# Patient Record
Sex: Female | Born: 1965 | Race: Black or African American | Hispanic: No | Marital: Single | Smoking: Never smoker
Health system: Southern US, Community
[De-identification: ages and names within clinical notes are randomized; demographics above are authoritative.]

## PROBLEM LIST (undated history)

## (undated) DIAGNOSIS — I10 Essential (primary) hypertension: Secondary | ICD-10-CM

## (undated) HISTORY — PX: OTHER SURGICAL HISTORY: SHX169

---

## 2001-06-25 ENCOUNTER — Emergency Department (HOSPITAL_COMMUNITY): Admission: EM | Admit: 2001-06-25 | Discharge: 2001-06-25 | Payer: Self-pay | Admitting: Emergency Medicine

## 2002-04-02 ENCOUNTER — Other Ambulatory Visit: Admission: RE | Admit: 2002-04-02 | Discharge: 2002-04-02 | Payer: Self-pay | Admitting: Family Medicine

## 2002-05-29 ENCOUNTER — Ambulatory Visit (HOSPITAL_COMMUNITY): Admission: RE | Admit: 2002-05-29 | Discharge: 2002-05-29 | Payer: Self-pay | Admitting: Gynecology

## 2002-05-29 ENCOUNTER — Encounter: Payer: Self-pay | Admitting: Obstetrics

## 2003-08-28 ENCOUNTER — Other Ambulatory Visit: Admission: RE | Admit: 2003-08-28 | Discharge: 2003-08-28 | Payer: Self-pay | Admitting: Family Medicine

## 2004-11-09 ENCOUNTER — Other Ambulatory Visit: Admission: RE | Admit: 2004-11-09 | Discharge: 2004-11-09 | Payer: Self-pay | Admitting: Family Medicine

## 2005-01-21 ENCOUNTER — Emergency Department (HOSPITAL_COMMUNITY): Admission: EM | Admit: 2005-01-21 | Discharge: 2005-01-21 | Payer: Self-pay | Admitting: Family Medicine

## 2006-05-28 ENCOUNTER — Emergency Department (HOSPITAL_COMMUNITY): Admission: EM | Admit: 2006-05-28 | Discharge: 2006-05-28 | Payer: Self-pay | Admitting: Emergency Medicine

## 2006-06-01 ENCOUNTER — Other Ambulatory Visit: Admission: RE | Admit: 2006-06-01 | Discharge: 2006-06-01 | Payer: Self-pay | Admitting: Family Medicine

## 2006-11-10 ENCOUNTER — Ambulatory Visit (HOSPITAL_COMMUNITY): Admission: RE | Admit: 2006-11-10 | Discharge: 2006-11-10 | Payer: Self-pay | Admitting: Obstetrics

## 2006-11-10 IMAGING — US US PELVIS COMPLETE MODIFY
1 series · 13 of 25 positions shown · non-contrast
Comparison: Report from prior pelvic ultrasound on [DATE].

CLINICAL DATA: Menorrhagia. Dysfunctional uterine bleeding past 5 months.
TRANSABDOMINAL AND TRANSVAGINAL PELVIC ULTRASOUND:
TECHNIQUE: Both transabdominal and transvaginal ultrasound examinations of the pelvis were performed including evaluation of the uterus, ovaries, adnexal regions, and pelvic cul-de-sac.

[Series 1: us pelvis complete modify · 0.30mm/px · 13 of 84 slices shown]
[im 1/84]
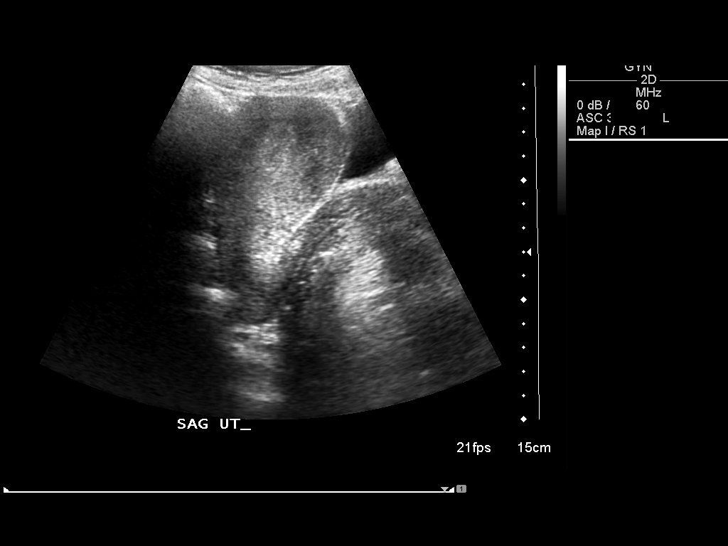
[im 7/84]
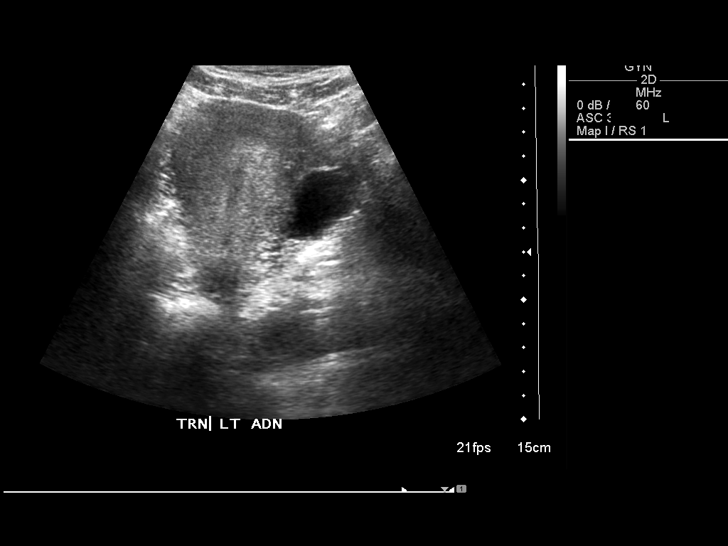
[im 14/84]
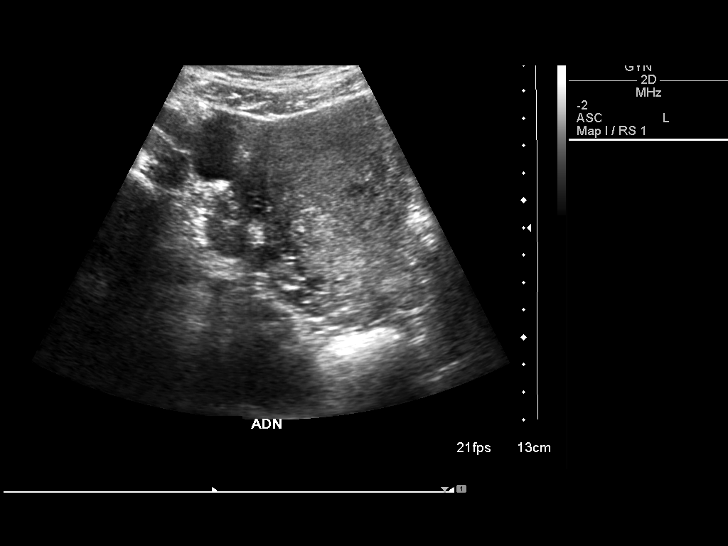
[im 21/84]
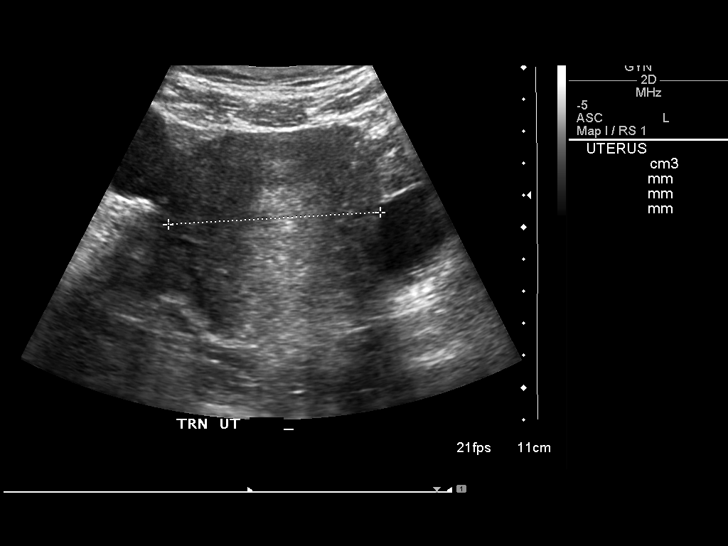
[im 28/84]
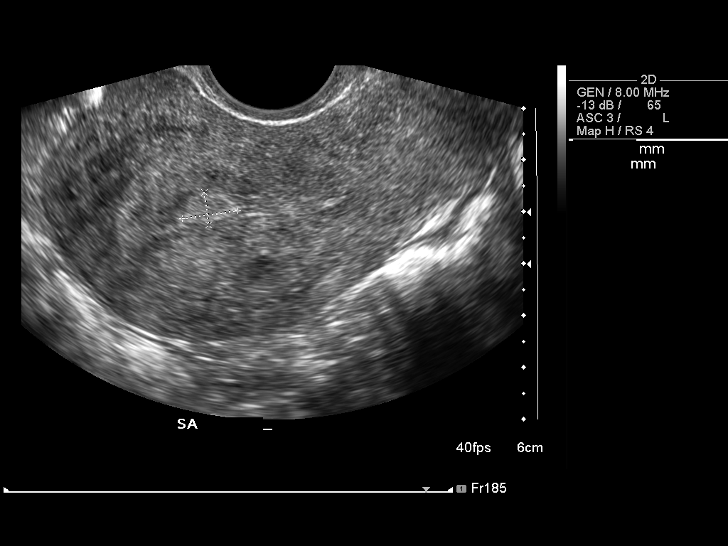
[im 35/84]
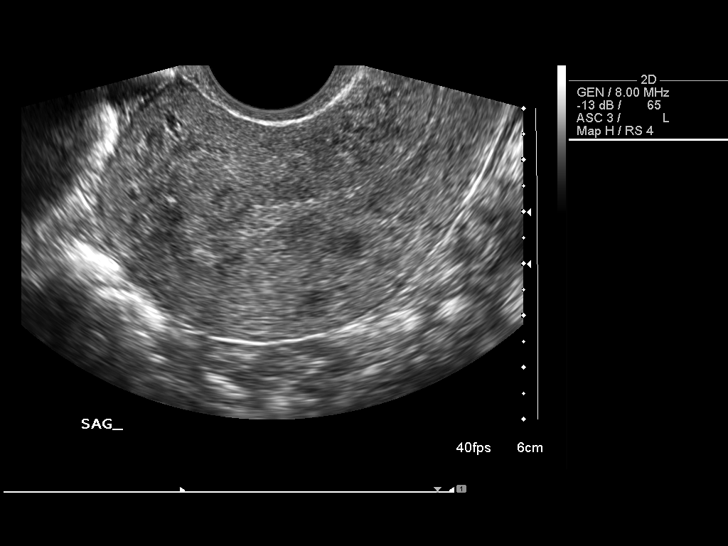
[im 42/84]
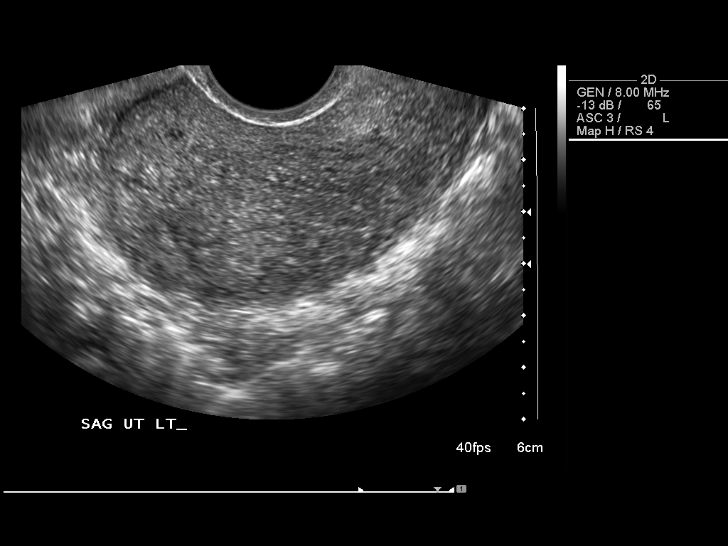
[im 49/84]
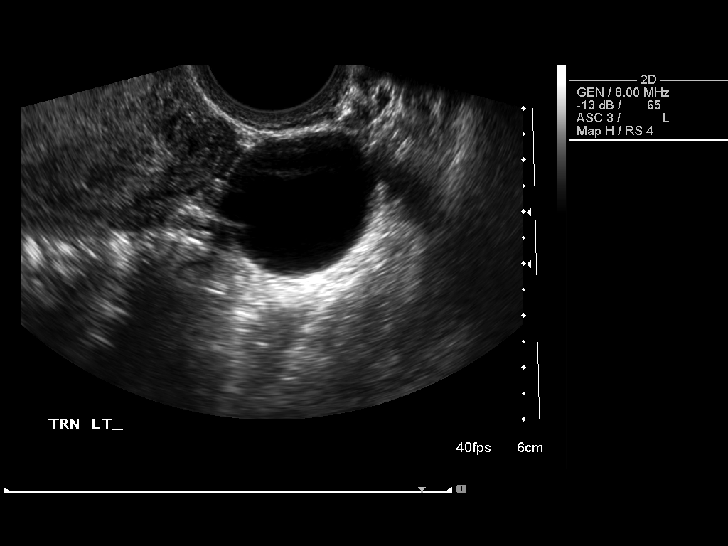
[im 56/84]
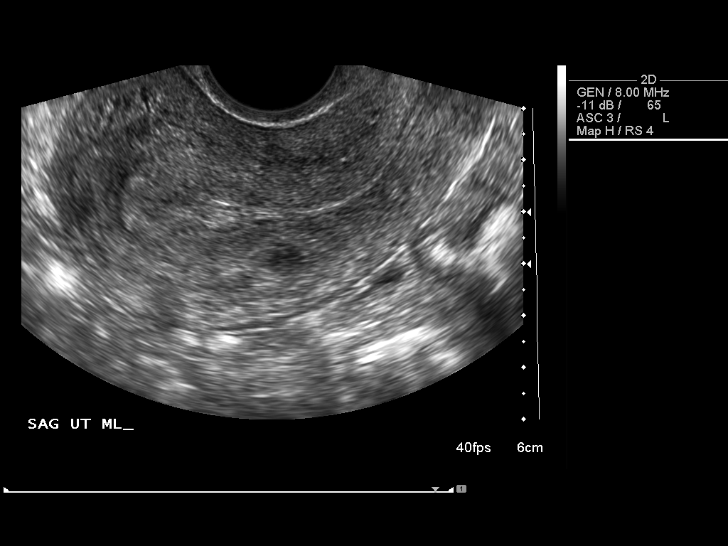
[im 63/84]
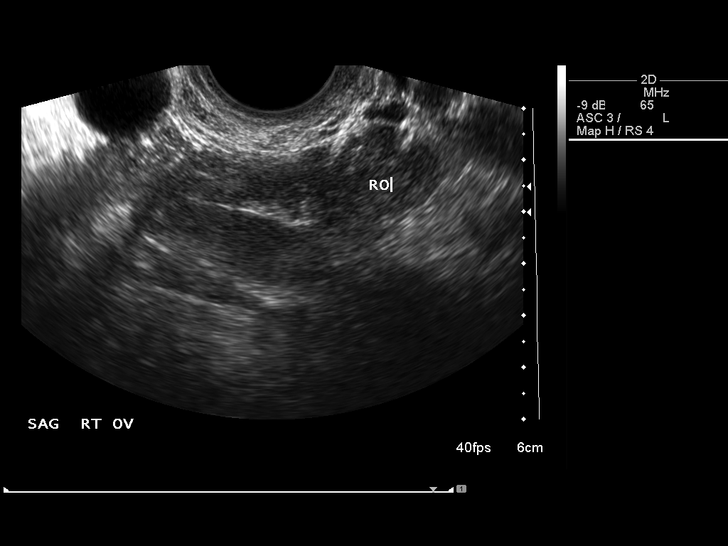
[im 70/84]
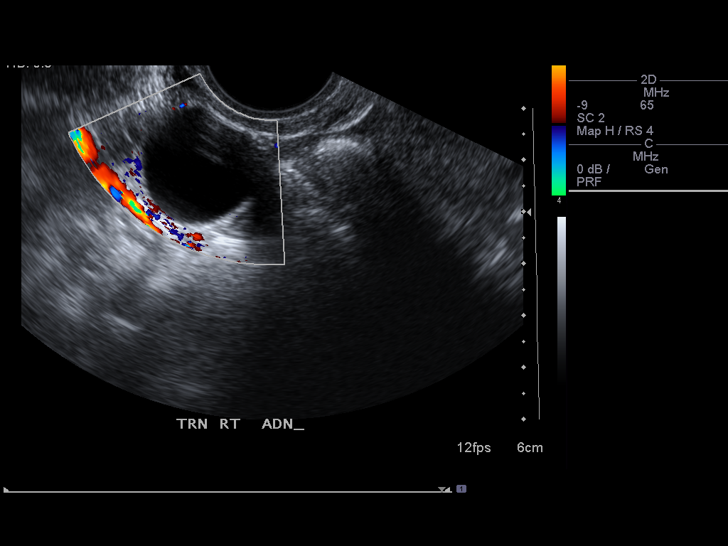
[im 77/84]
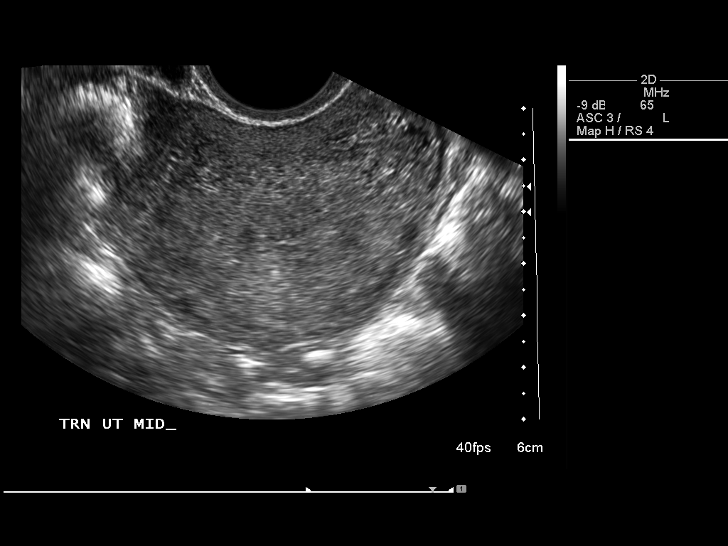
[im 84/84]
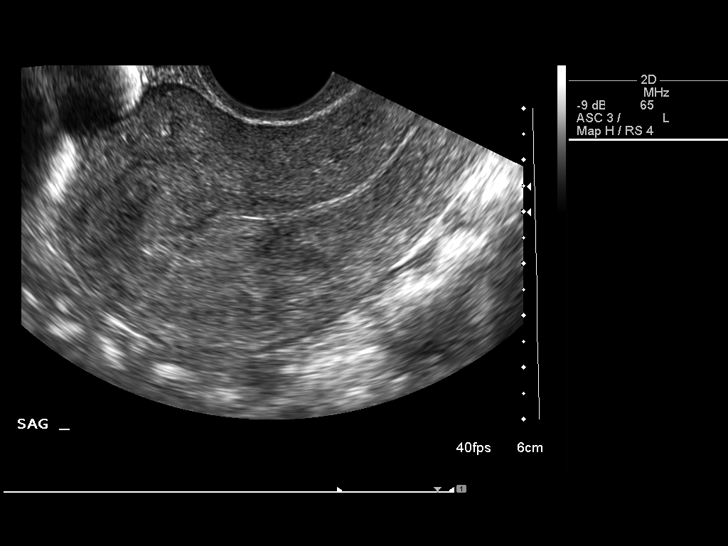

[13 of 25 positions shown; findings below may reference images not displayed]

FINDINGS: The uterus is at the upper limits of normal in size measuring approximately 10 x 5 x 6 cm.  Diffusely heterogeneous appearance of the myometrium is seen with several small posterior uterine fibroids measuring 1 cm in diameter or less.  Endometrial thickness measures approximately 7 mm.  A focal hyperechoic area is again seen within the central portion of the endometrial stripe measuring approximately 6 x 10 mm, and this could represent blood clot given the patient?s history of ongoing bleeding, although an endometrial polyp cannot be excluded. 
The right ovary is normal in appearance. A simple cyst is seen in the right adnexa separate from the ovary which measures 3 cm.  This is unchanged compared with the prior report and is consistent with a paraovarian cyst.    There is also a simple cyst along the margin of the left ovary which measures 4.0 x 2.8 x 2.9 cm.  This was not described on previous study and could represent an exophytic ovarian cyst or paraovarian cyst.  There is no evidence of free fluid.
IMPRESSION: 1.  Several small posterior uterine fibroids measuring 1 cm in size or less.
2.  6 x 10 mm hyperechoic area in the endometrial cavity.  This could represent blood clot, however, an endometrial polyp cannot be excluded. Sonohysterogram is recommended for further evaluation.
3.  Stable 3 cm right paraovarian cyst.
4.  4 cm simple cyst in the left adnexa, which was not described on prior ultrasound report in [9B], and may represent an ovarian cyst or paraovarian cyst.

## 2006-11-20 ENCOUNTER — Encounter: Admission: RE | Admit: 2006-11-20 | Discharge: 2006-11-20 | Payer: Self-pay | Admitting: Family Medicine

## 2006-11-20 IMAGING — CR DG SINUSES COMPLETE 3+V
3 series · 3 of 3 positions shown · non-contrast
Comparison: none

CLINICAL DATA: Frontal and maxillary pain. 
 SINUSES - 3 VIEW:

[[person_name]]
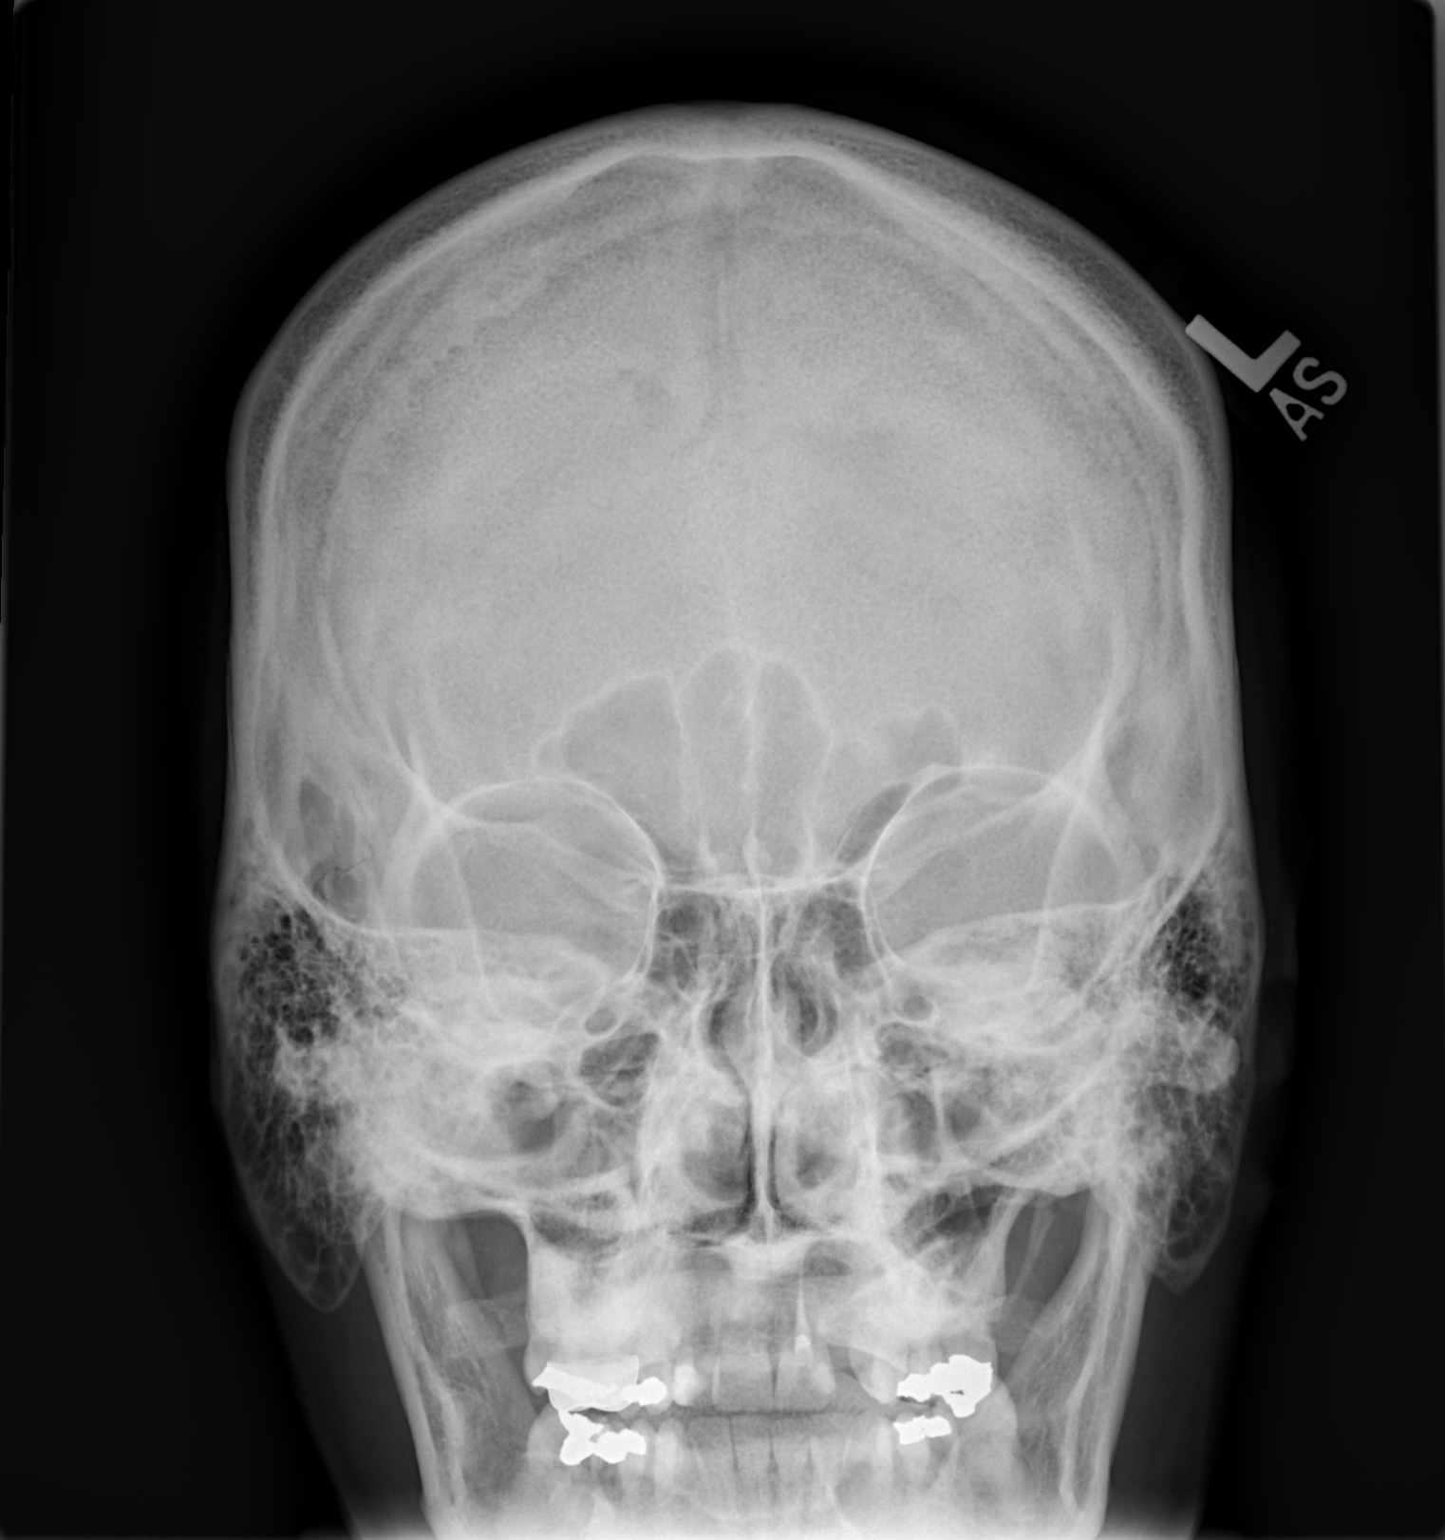

[w waters]
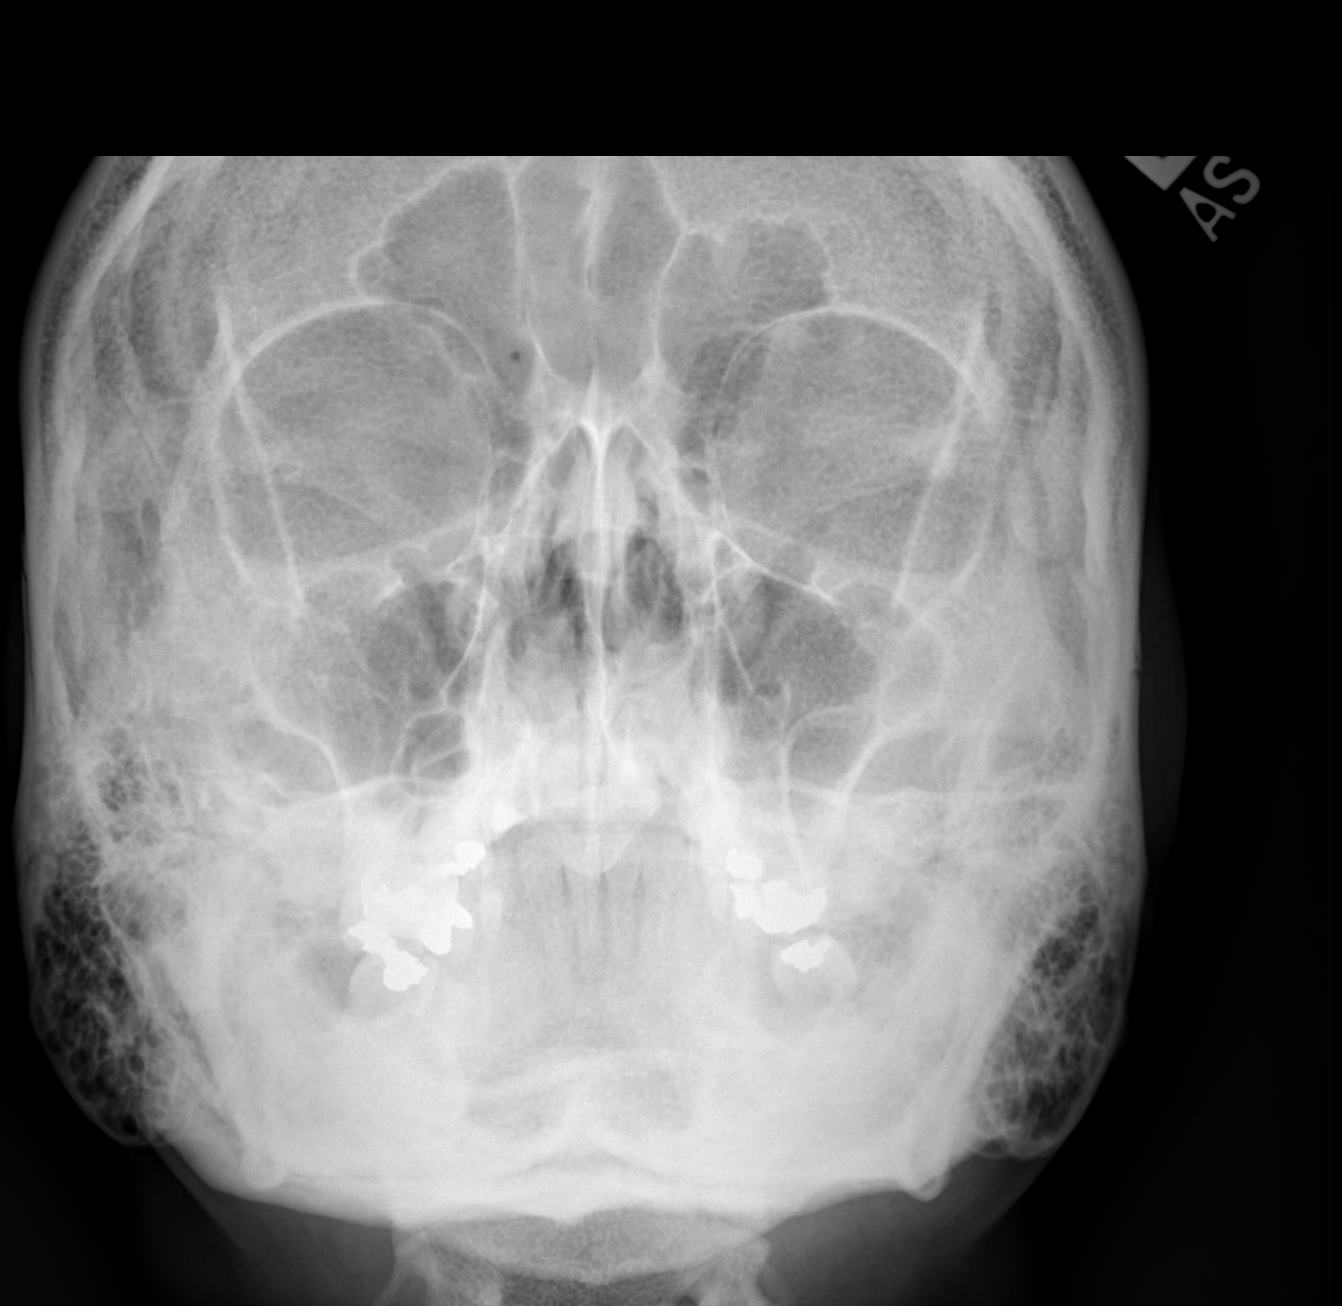

[w smv]
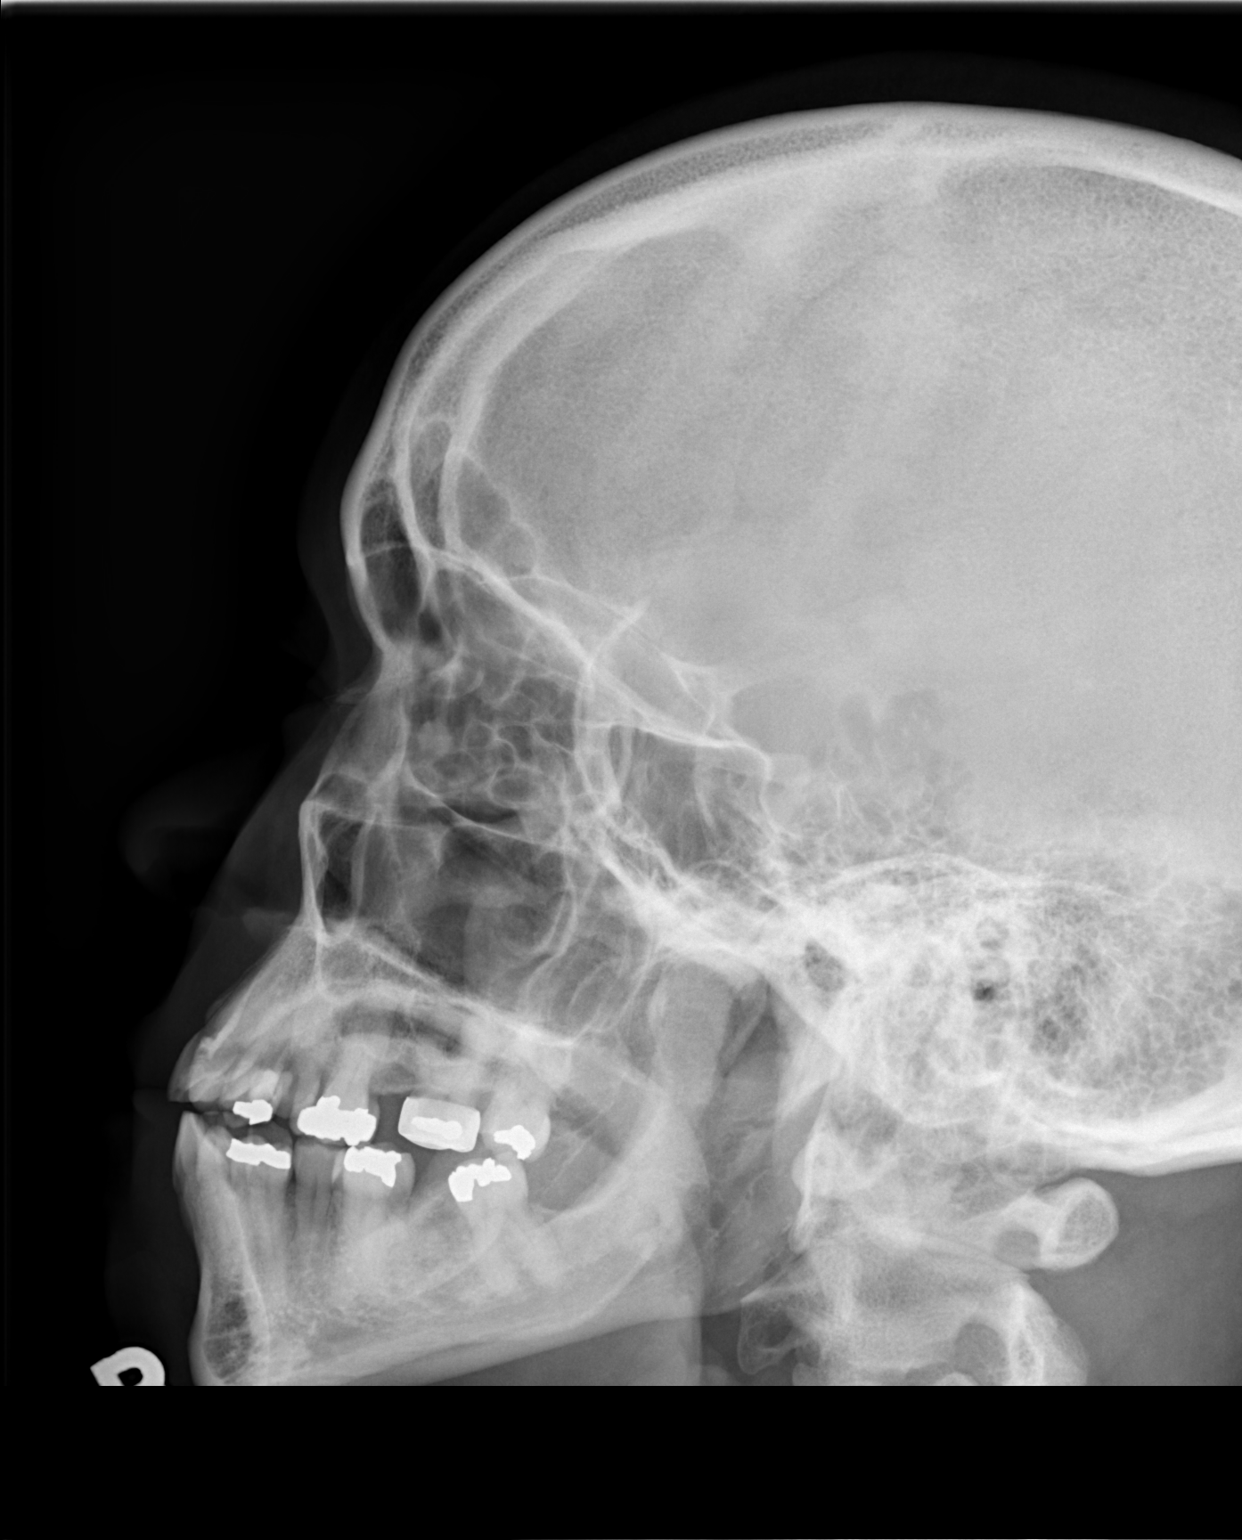

[3 of 3 positions shown; findings below may reference images not displayed]

FINDINGS: The mastoid air cells and sinuses clear.  Specifically, there is no air fluid densities nor mucoperiosteal thickening.
IMPRESSION: Negative for sinusitis.

## 2006-12-07 ENCOUNTER — Ambulatory Visit (HOSPITAL_COMMUNITY): Admission: RE | Admit: 2006-12-07 | Discharge: 2006-12-07 | Payer: Self-pay | Admitting: Obstetrics

## 2006-12-07 ENCOUNTER — Encounter: Payer: Self-pay | Admitting: Obstetrics

## 2007-02-22 ENCOUNTER — Emergency Department (HOSPITAL_COMMUNITY): Admission: EM | Admit: 2007-02-22 | Discharge: 2007-02-23 | Payer: Self-pay | Admitting: Emergency Medicine

## 2007-02-22 IMAGING — CR DG CERVICAL SPINE COMPLETE 4+V
5 series · 5 of 5 positions shown · non-contrast
Comparison: none

CLINICAL DATA: Right shoulder numbness.
 CERVICAL SPINE ? 5 VIEWS ? [DATE]:

[w c-spine lat]
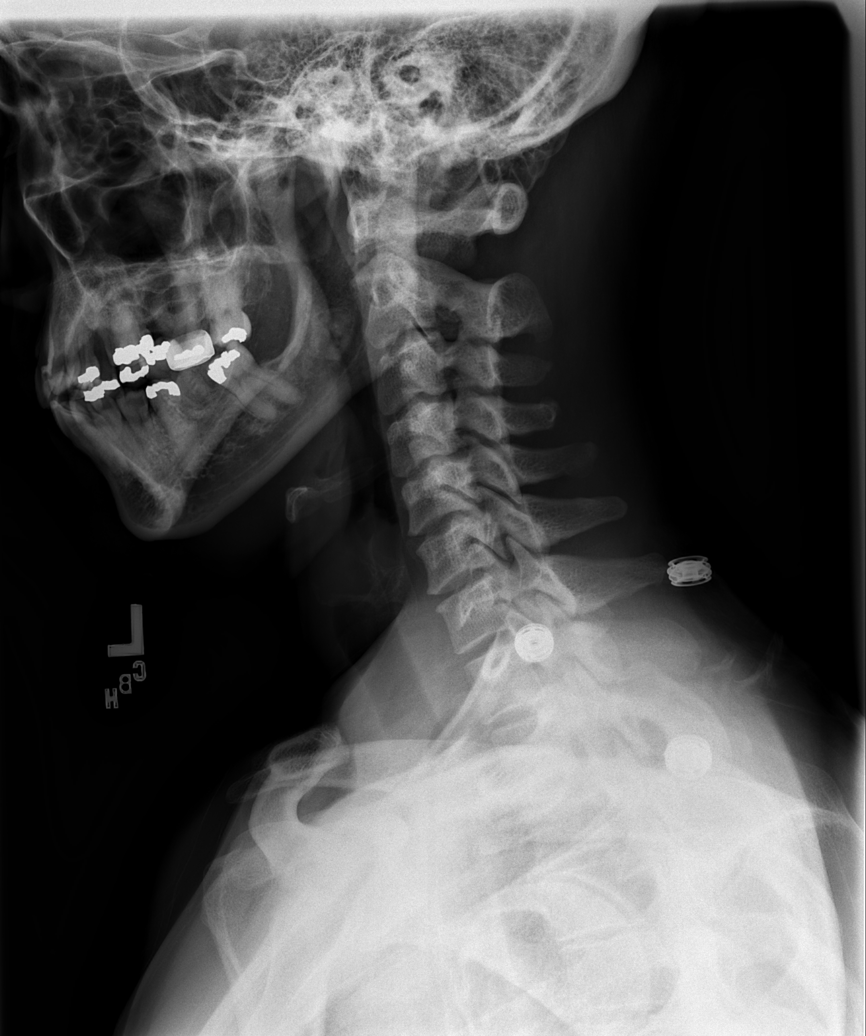

[w c-spine oblique (1 of 2)]
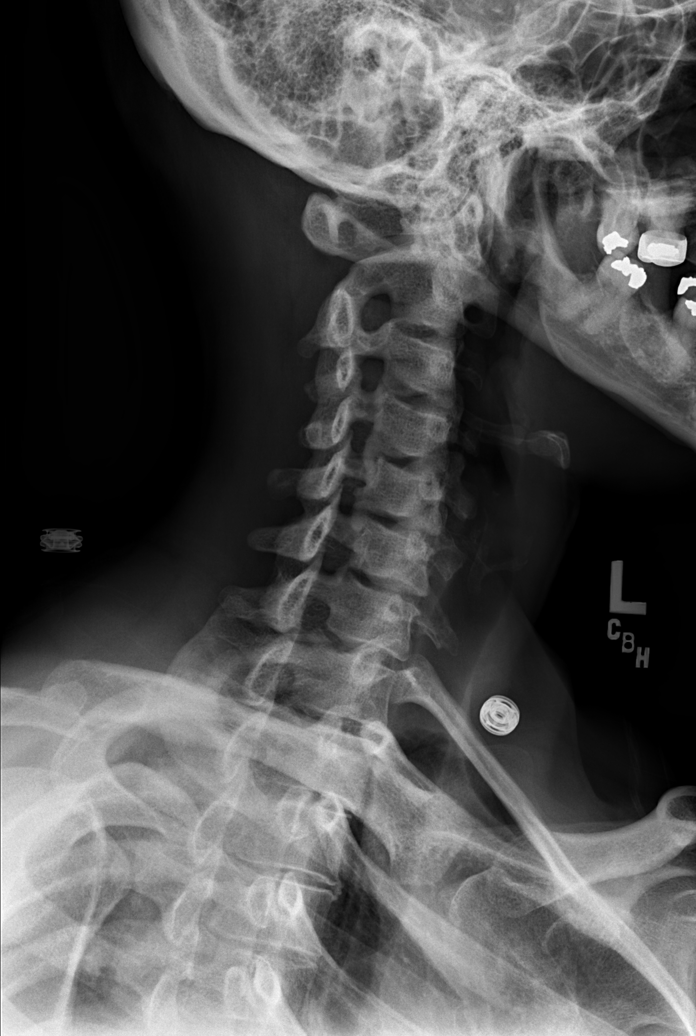

[w c-spine oblique (2 of 2)]
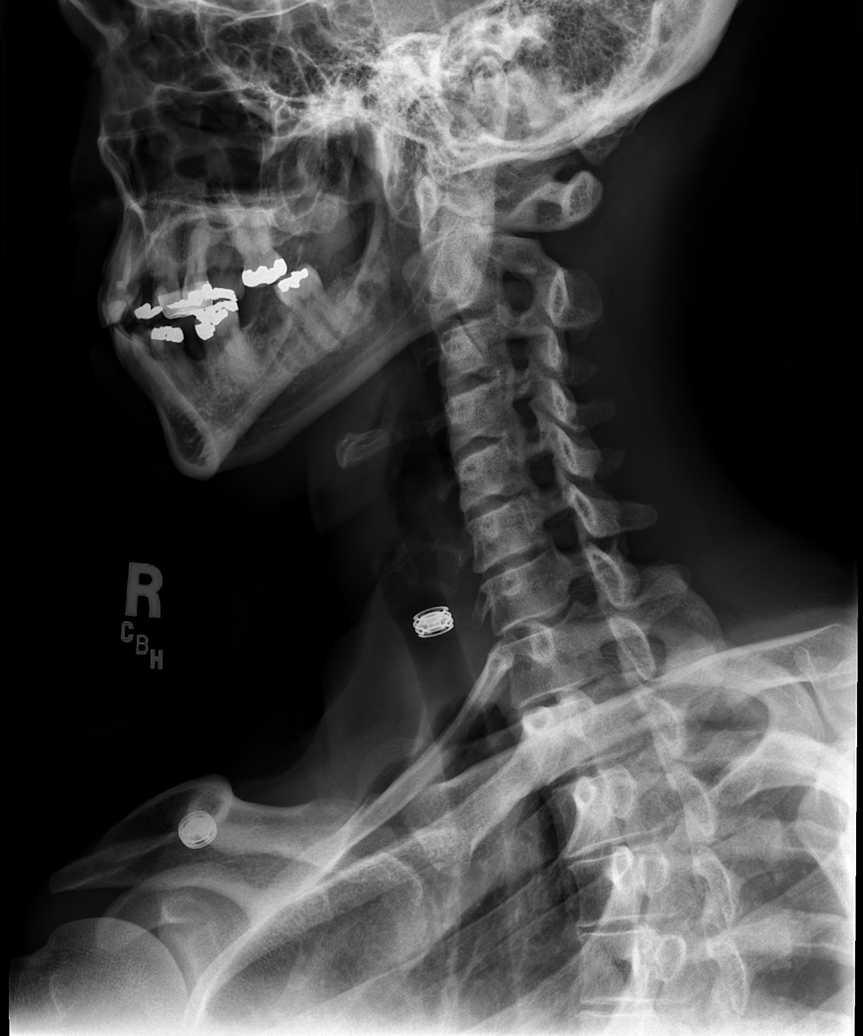

[w c-spine a.p.]
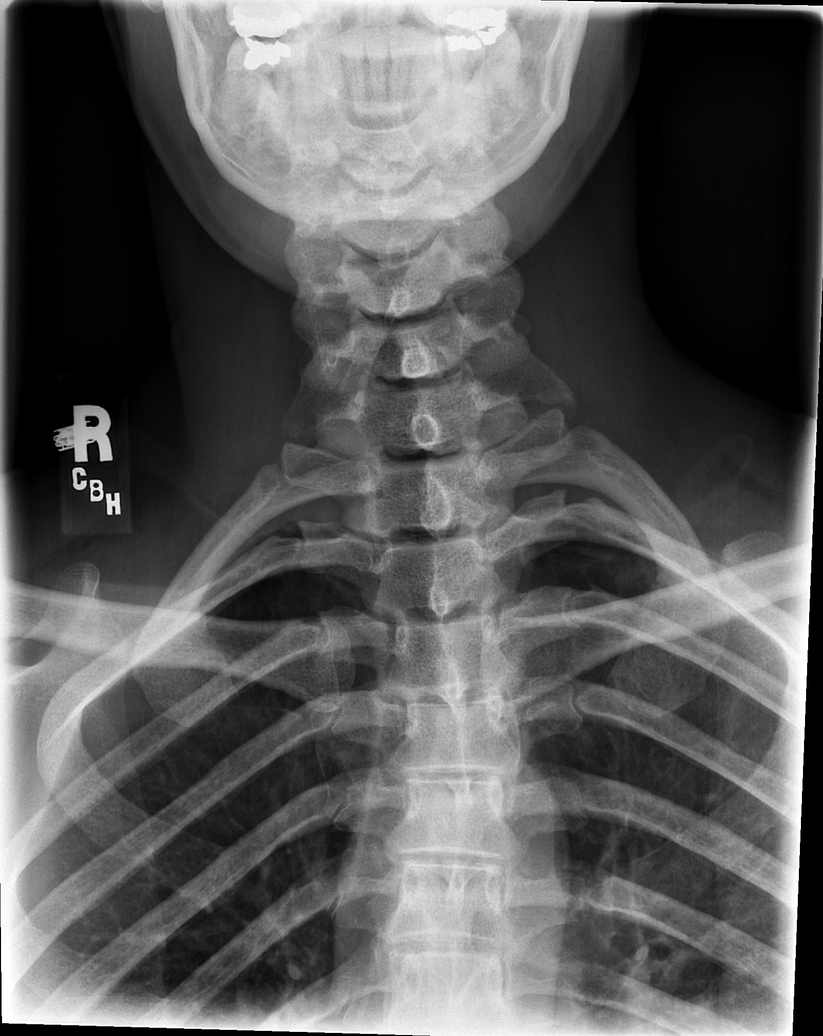

[w c-spine odontoid]
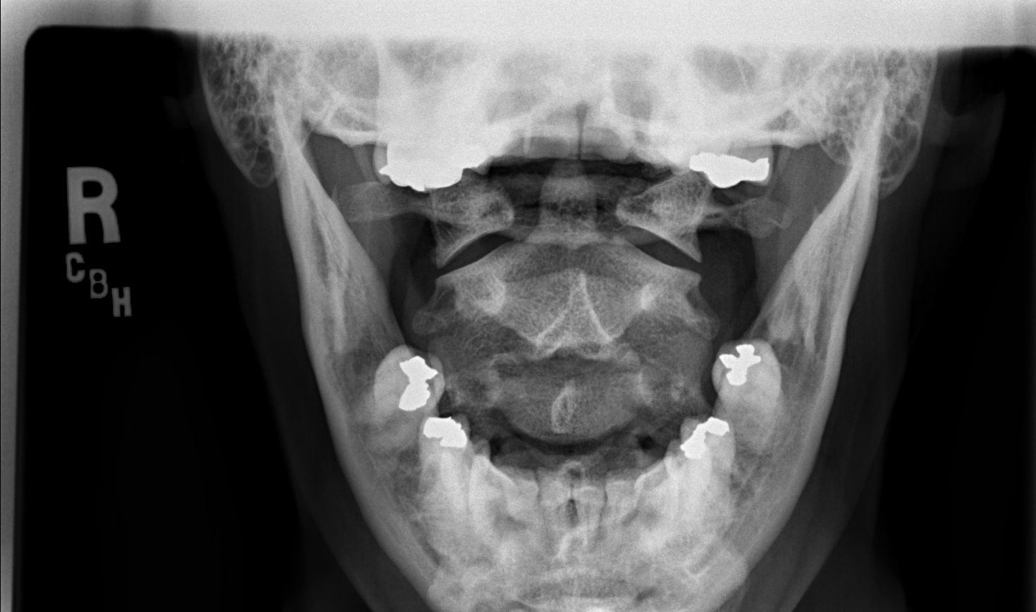

[5 of 5 positions shown; findings below may reference images not displayed]

FINDINGS: There is no evidence of fracture or prevertebral soft tissue soft tissue swelling.  Alignment is normal.  The intervertebral disk spaces are within normal limits.  No other significant bone abnormalities are identified.
IMPRESSION: Negative cervical spine radiographs.

## 2008-06-26 ENCOUNTER — Emergency Department (HOSPITAL_COMMUNITY): Admission: EM | Admit: 2008-06-26 | Discharge: 2008-06-26 | Payer: Self-pay | Admitting: Emergency Medicine

## 2008-06-26 IMAGING — CR DG LUMBAR SPINE COMPLETE 4+V
5 series · 5 of 5 positions shown · non-contrast
Comparison: None

CLINICAL DATA: Back pain

LUMBAR SPINE - COMPLETE 4+ VIEW

[t l-spine a.p.]
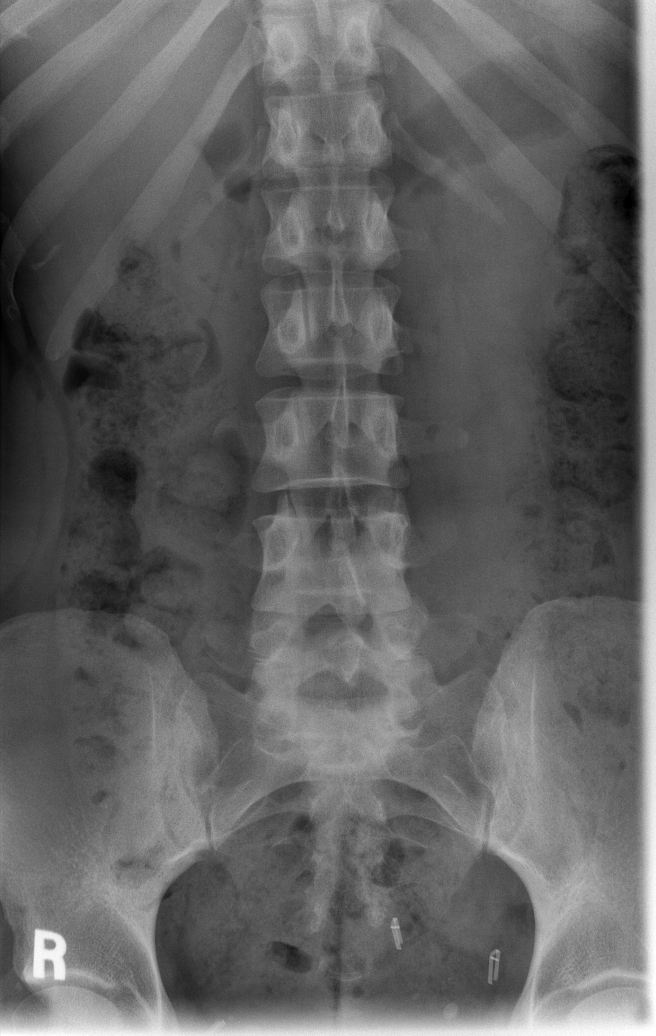

[t l-spine oblique exposure (1 of 2)]
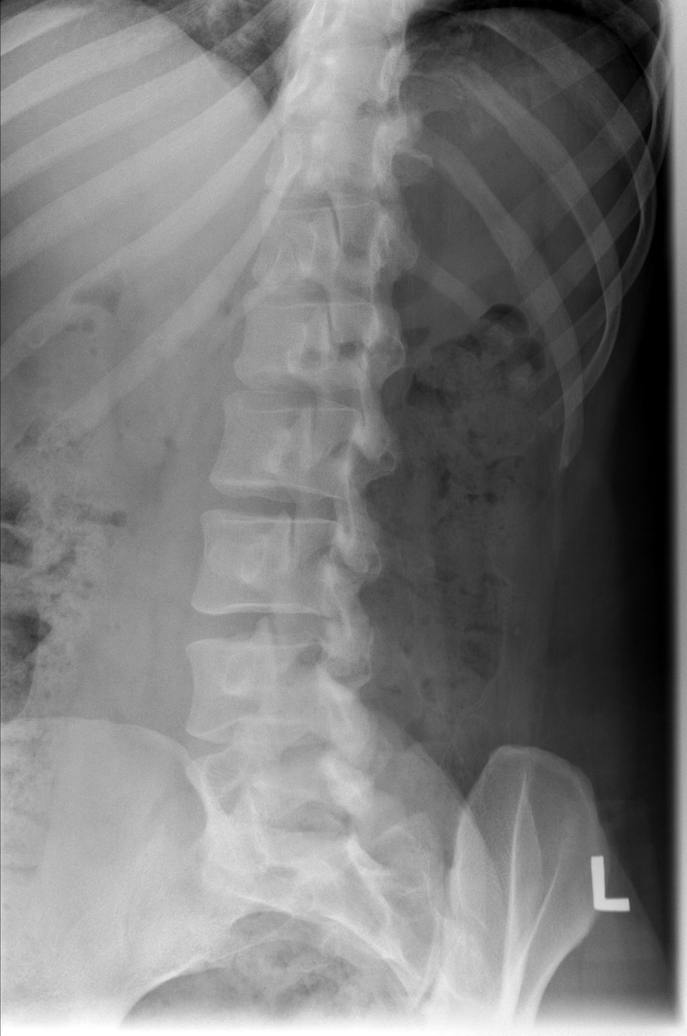

[t l-spine oblique exposure (2 of 2)]
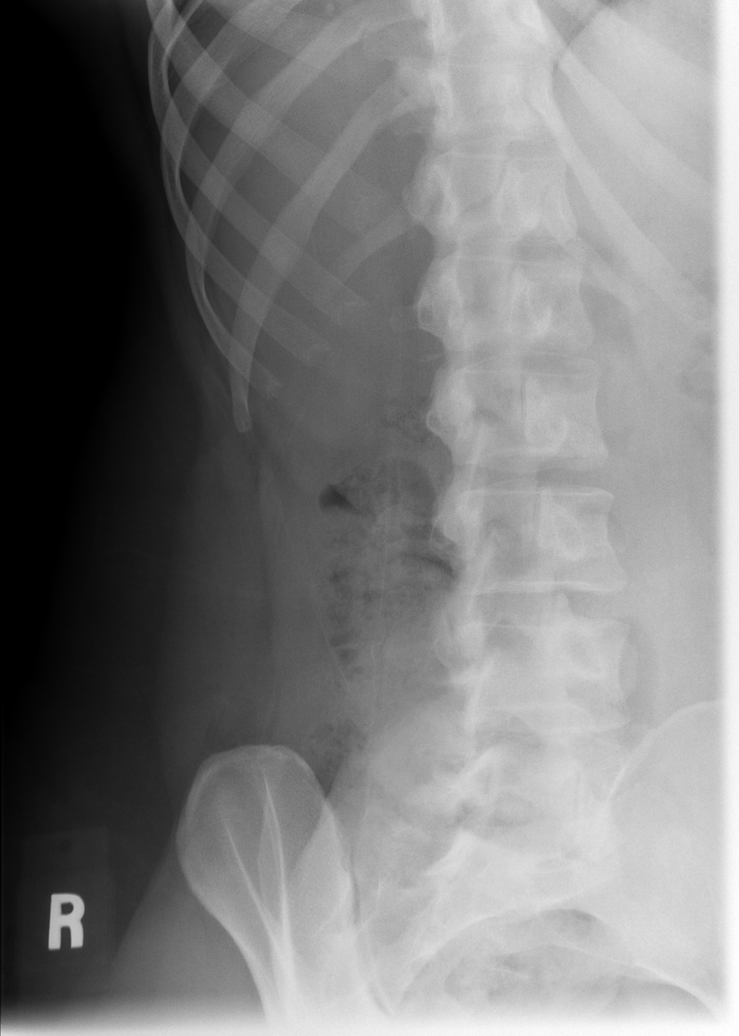

[t l-spine lat]
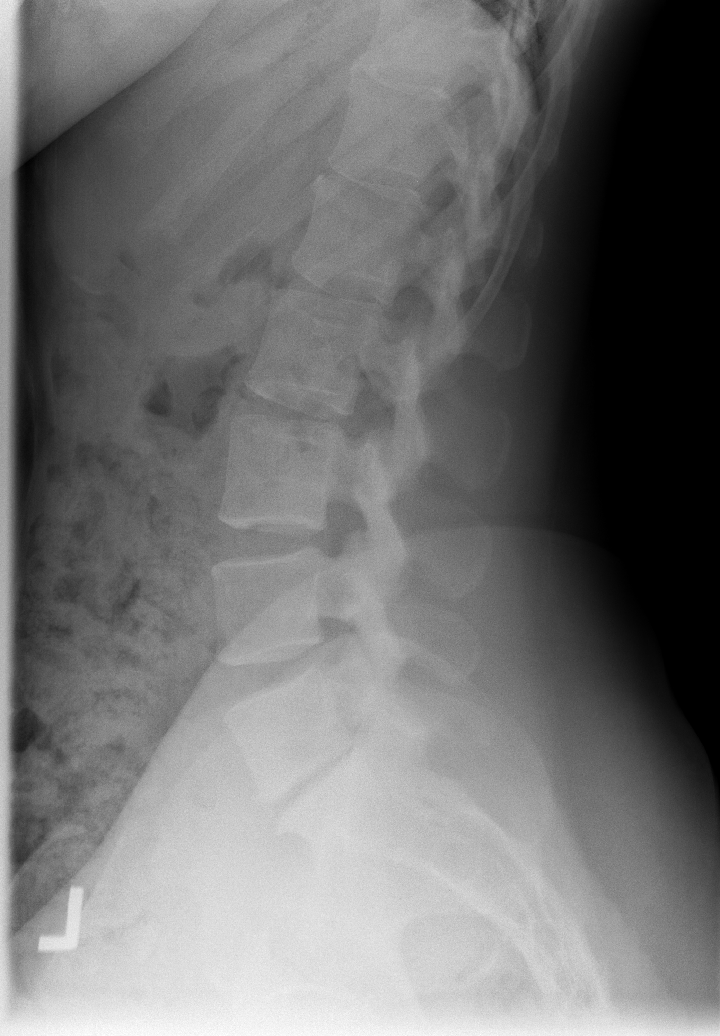

[t l-spine l5-s1 spot]
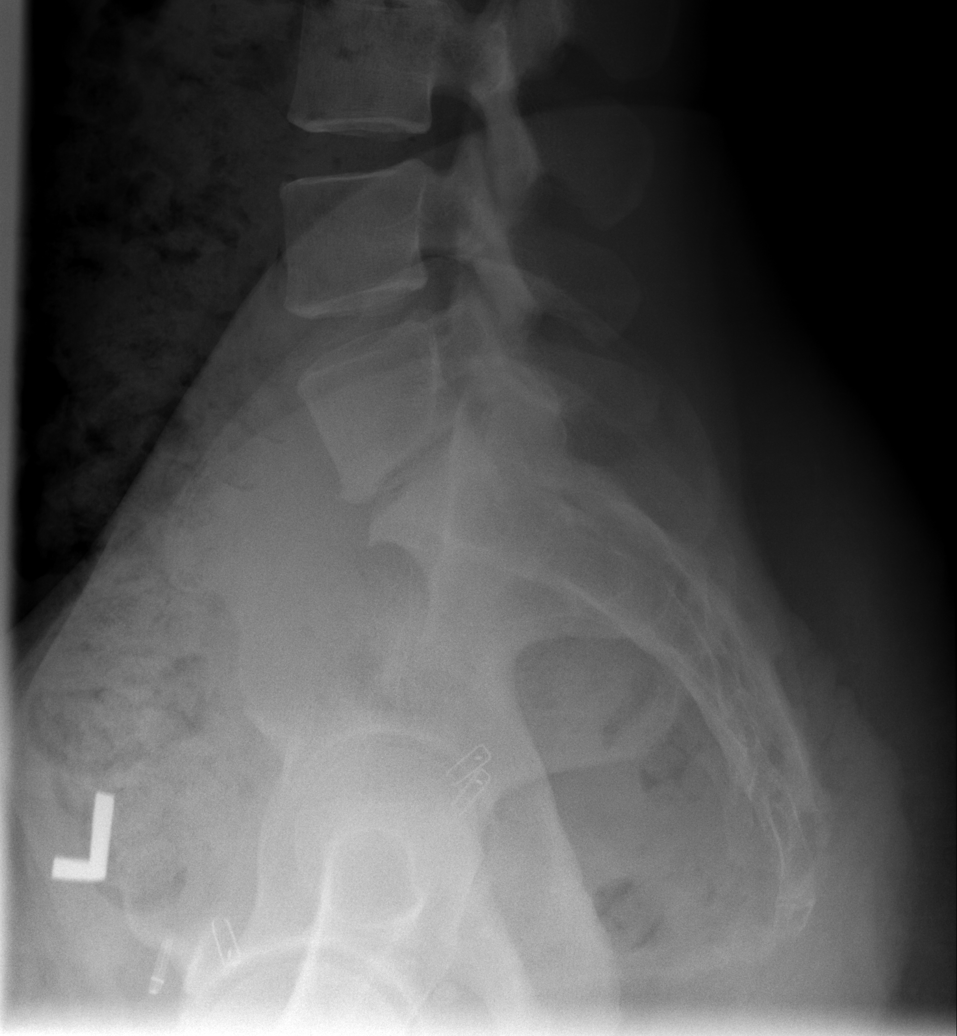

[5 of 5 positions shown; findings below may reference images not displayed]

FINDINGS: There are five non-rib bearing lumbar type vertebra.
There is marked degenerative disc disease at L5-S1 with mild facet
arthropathy.  Other disc space levels normal.  No fracture or
subluxation.  Soft tissues unremarkable except for clips related to
prior tubal ligation.
IMPRESSION: Degenerative disc disease at L5-S1.  No other acute or significant
findings.

## 2008-06-26 IMAGING — CT CT PELVIS W/O CM
2 of 4 series · 17 of 46 positions shown, 19 images · non-contrast
Comparison: None

CT ABDOMEN

CLINICAL DATA: Low back pain, right flank pain.

CT OF THE ABDOMEN AND PELVIS WITHOUT CONTRAST (CT UROGRAM)
TECHNIQUE: Multidetector CT imaging was performed through the
abdomen and pelvis to include the urinary tract.

[Series 2: <(id) stone a/p >(id) · axial · 0.75mm/px · z∈[-443,-68]mm · 14 of 83 slices shown, 16 images]
[im 4/83  soft-tissue]
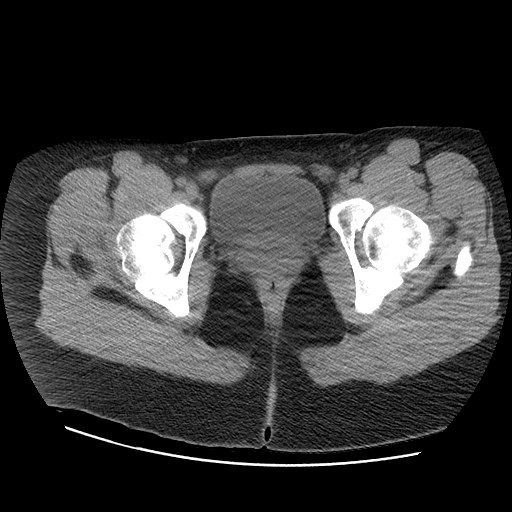
[im 4/83  bone]
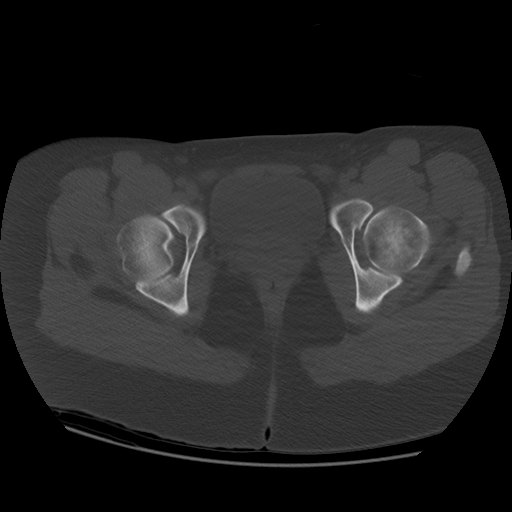
[im 10/83  soft-tissue]
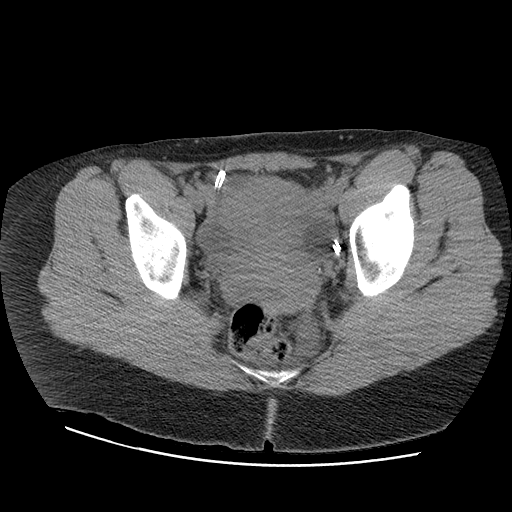
[im 16/83  soft-tissue]
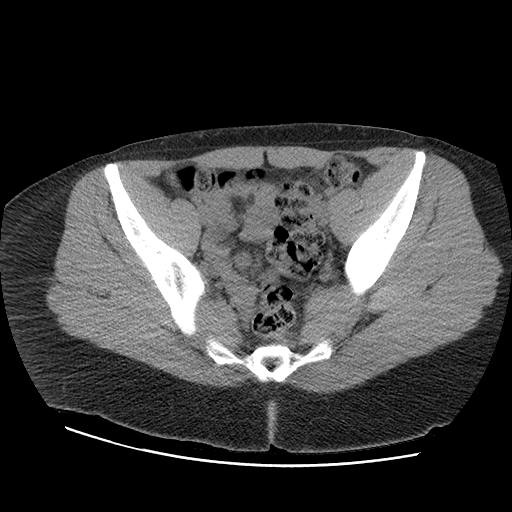
[im 23/83  soft-tissue]
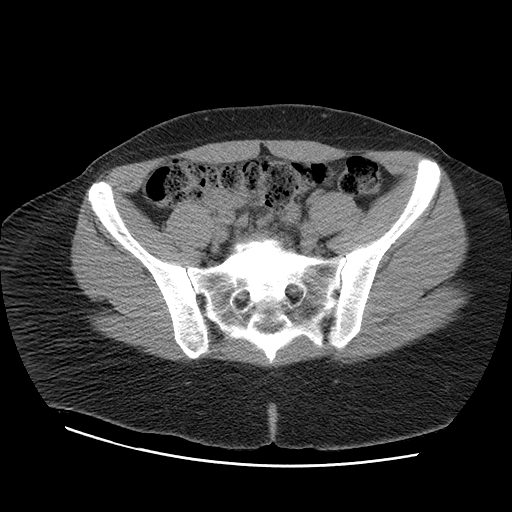
[im 29/83  soft-tissue]
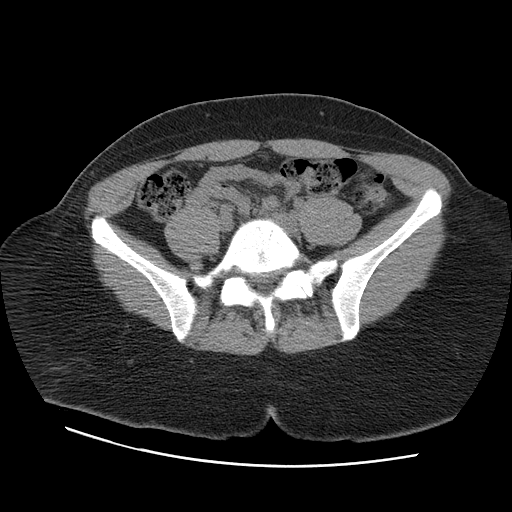
[im 32/83  soft-tissue]
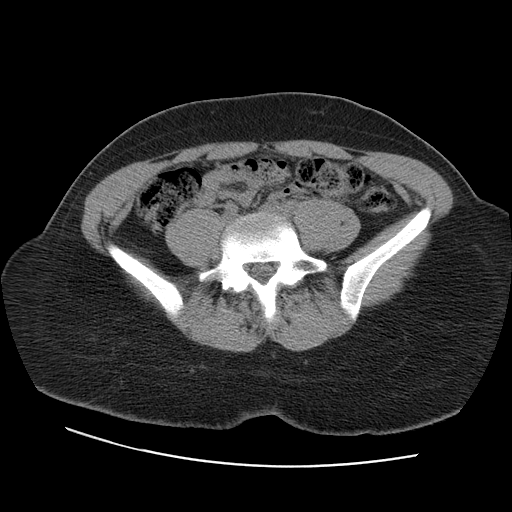
[im 38/83  soft-tissue]
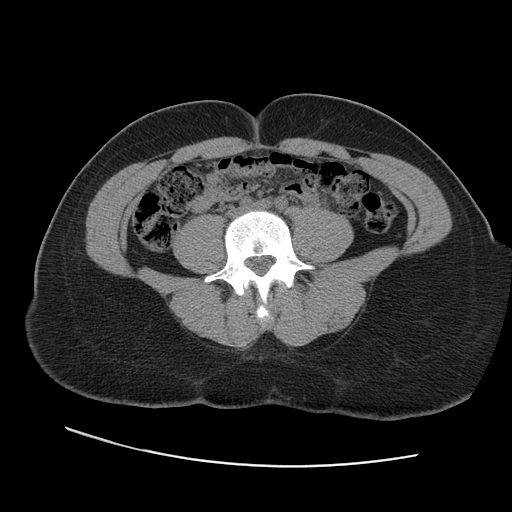
[im 45/83  soft-tissue]
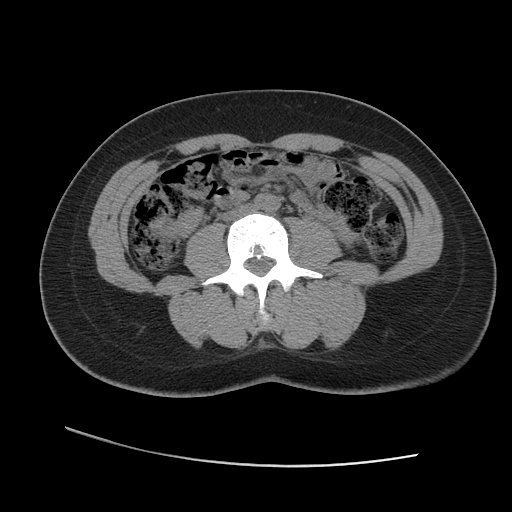
[im 51/83  soft-tissue]
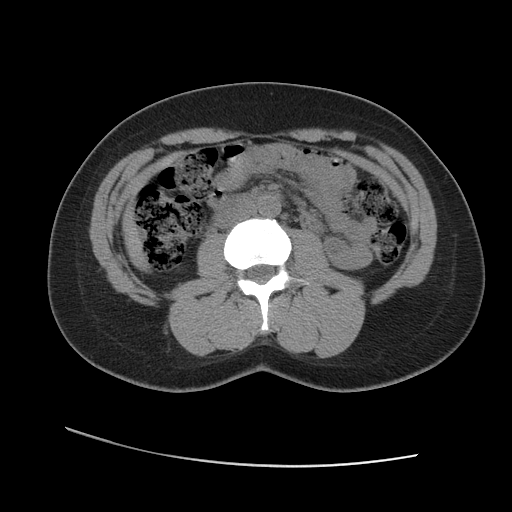
[im 51/83  bone]
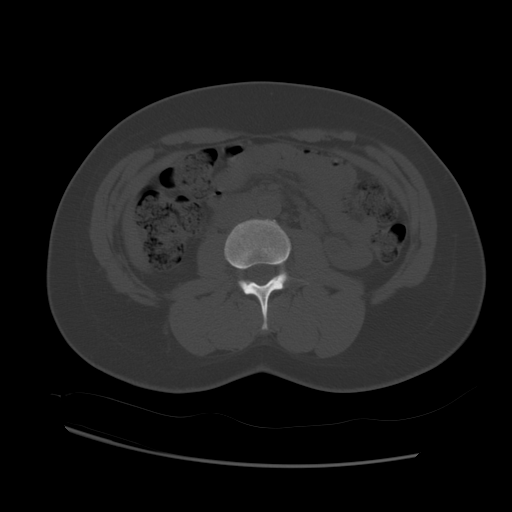
[im 54/83  soft-tissue]
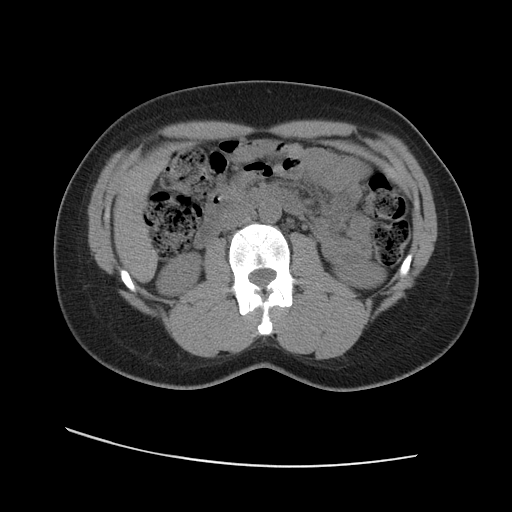
[im 60/83  soft-tissue]
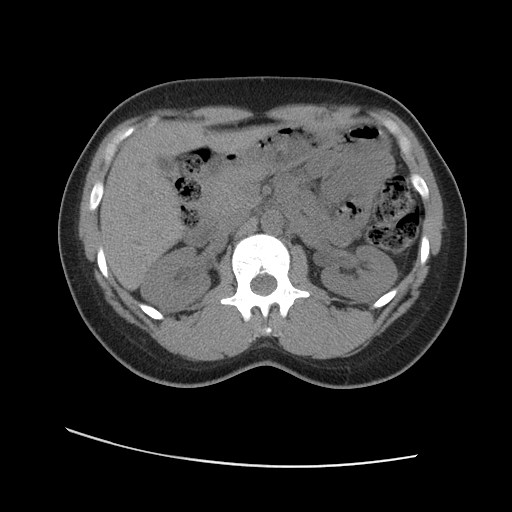
[im 67/83  soft-tissue]
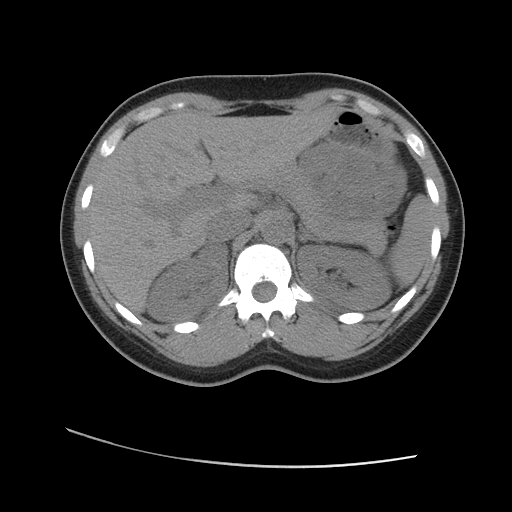
[im 73/83  soft-tissue]
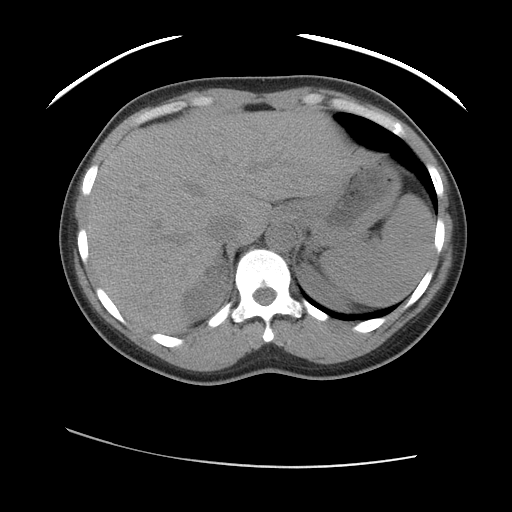
[im 79/83  soft-tissue]
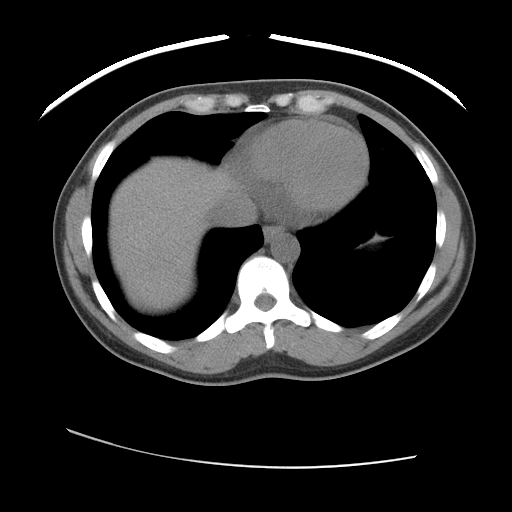

[Series 401: cor · coronal · 0.94mm/px · 3 of 89 slices shown]
[im 30/89  soft-tissue]
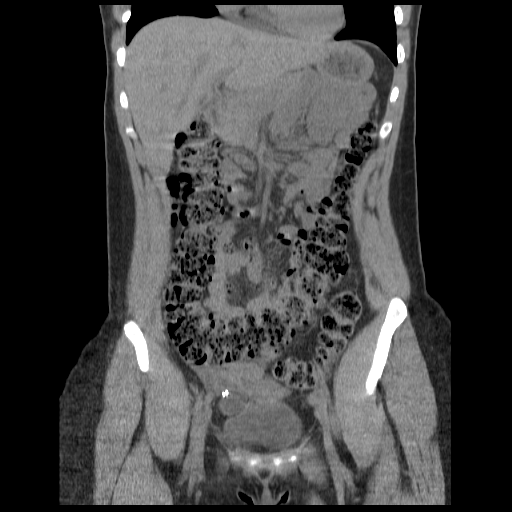
[im 40/89  soft-tissue]
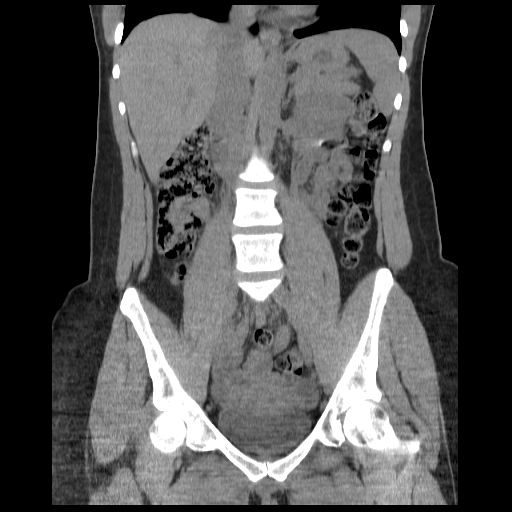
[im 49/89  soft-tissue]
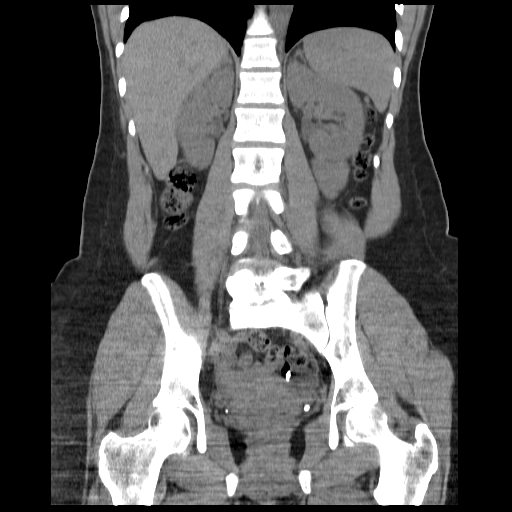

[17 of 46 positions shown; findings below may reference images not displayed]

FINDINGS: Lung bases are clear.  No effusions.  Heart is normal
size.

Suspect tiny punctate nonobstructing stone in the lower pole of the
right kidney.  No hydronephrosis.  No proximal ureteral stones.

Gallbladder is contracted.  Solid organs have an unremarkable
unenhanced appearance. Bowel grossly unremarkable.  No free fluid,
free air, or adenopathy. Aorta is normal caliber.

No acute bony abnormality.
IMPRESSION: No acute findings in the abdomen.

Possible tiny punctate nonobstructing stone in the lower pole of
the right kidney.

CT PELVIS
FINDINGS: Ureters are decompressed and difficult to follow.
Multiple phleboliths noted in the pelvis.  There is a tiny punctate
calcification on the right that on image 77.  I suspect this is a
phlebolith, but it is difficult to completely clear the distal
right ureter.  Given that there is no hydronephrosis or
hydroureter, distal ureteral stone is felt unlikely.

Bilateral ovarian cysts present.  The patient is status post
bilateral tubal ligation.  Uterus grossly unremarkable unenhanced
appearance.

Bowel grossly unremarkable.  No free fluid, free air, or
adenopathy. Appendix visualized and is normal.
IMPRESSION: No definite ureteral calculi.  There is a small punctate
calcification in the right side of the pelvis which cannot be
definitively cleared from the distal right ureter, but this is felt
less likely given the decompressed right urinary tract.

Bilateral ovarian cysts.

Appendix normal.

## 2008-07-02 ENCOUNTER — Other Ambulatory Visit: Admission: RE | Admit: 2008-07-02 | Discharge: 2008-07-02 | Payer: Self-pay | Admitting: Family Medicine

## 2010-04-14 ENCOUNTER — Inpatient Hospital Stay (INDEPENDENT_AMBULATORY_CARE_PROVIDER_SITE_OTHER)
Admission: RE | Admit: 2010-04-14 | Discharge: 2010-04-14 | Disposition: A | Discharge status: Home / Self Care | Payer: Self-pay | Source: Ambulatory Visit | Attending: Family Medicine | Admitting: Family Medicine

## 2010-04-14 DIAGNOSIS — M545 Low back pain, unspecified: Secondary | ICD-10-CM

## 2010-04-14 DIAGNOSIS — R350 Frequency of micturition: Secondary | ICD-10-CM

## 2010-04-14 LAB — POCT URINALYSIS DIP (DEVICE)
Nitrite: NEGATIVE
Protein, ur: NEGATIVE mg/dL
Specific Gravity, Urine: 1.015 (ref 1.005–1.030)
Urobilinogen, UA: 0.2 mg/dL (ref 0.0–1.0)

## 2010-04-14 LAB — POCT PREGNANCY, URINE: Preg Test, Ur: NEGATIVE

## 2010-04-16 LAB — URINE CULTURE: Colony Count: >=100000

## 2010-05-03 LAB — URINE MICROSCOPIC-ADD ON

## 2010-05-03 LAB — URINALYSIS, ROUTINE W REFLEX MICROSCOPIC
Protein, ur: NEGATIVE mg/dL
Specific Gravity, Urine: 1.029 (ref 1.005–1.030)
Urobilinogen, UA: 1 mg/dL (ref 0.0–1.0)

## 2010-05-03 LAB — URINE CULTURE: Colony Count: 30000

## 2010-05-31 ENCOUNTER — Other Ambulatory Visit (HOSPITAL_COMMUNITY): Payer: Self-pay | Admitting: Family Medicine

## 2010-05-31 DIAGNOSIS — Z1231 Encounter for screening mammogram for malignant neoplasm of breast: Secondary | ICD-10-CM

## 2010-06-04 ENCOUNTER — Ambulatory Visit (HOSPITAL_COMMUNITY)
Admission: RE | Admit: 2010-06-04 | Discharge: 2010-06-04 | Disposition: A | Discharge status: Home / Self Care | Payer: Self-pay | Source: Ambulatory Visit | Attending: Family Medicine | Admitting: Family Medicine

## 2010-06-04 DIAGNOSIS — Z1231 Encounter for screening mammogram for malignant neoplasm of breast: Secondary | ICD-10-CM

## 2010-06-08 NOTE — Op Note (Signed)
[name], Debra Duncan              ACCOUNT NO.:  [id number]   MEDICAL RECORD NO.:  [id number]          PATIENT TYPE:  AMB   LOCATION:  [facility]                           FACILITY:  [facility]   PHYSICIAN:  Charles A. Clearance Coots, M.D.DATE OF BIRTH:  11/21/1965   DATE OF PROCEDURE:  12/07/2006  DATE OF DISCHARGE:                               OPERATIVE REPORT   PREOPERATIVE DIAGNOSIS:  Menorrhagia.   POSTOPERATIVE DIAGNOSIS:  Menorrhagia, endometrial polyp.   PROCEDURE:  Hysteroscopy, dilatation and curettage, NovaSure bipolar  endometrial ablation.   SURGEON:  Charles A. Clearance Coots, M.D.   ANESTHESIA:  MAC with paracervical block.   FINDINGS:  Endometrial polyp.   SPECIMEN:  Endometrial curettings   COMPLICATIONS:  None.   ESTIMATED BLOOD LOSS:  Minimal.   OPERATION:  The patient was brought to the operating room and after  satisfactory IV sedation, her legs were brought up in stirrups and the  vagina was prepped and draped in the usual sterile fashion. The urinary  bladder was emptied of approximately 50 mL of clear urine.  Bimanual  examination revealed the uterus to be mid position. A sterile speculum  was inserted in the vaginal vault and the cervix isolated.  The anterior  lip of the cervix was grasped with a single tooth tenaculum.  The  endocervical canal was then sounded to 3.5 cm from external os to  internal os with a Hagar dilator. The uterus was then sounded to 8.5 cm  with the uterine sound.  The cervix was then dilated to 25 mm Pratt  dilator. The 5 mm hysteroscope was then easily introduced into the  uterine cavity and hysteroscopic survey was performed.  A small anterior  endometrial polyp was observed.  Otherwise, the endometrial cavity was  within normal limits. The hysteroscope was removed and the endometrial  surface was curetted with a small sharp curette and the specimen was  submitted to pathology for evaluation. The bipolar ablation procedure  was then performed in  routine fashion at a power of 110 watts for 1  minute 20 seconds.  The bipolar ablation instrument was then removed and  hysteroscopic survey was again performed.  Both cornua were adequately  ablated along with the  fundus and the entire endometrial cavity appeared adequately ablated  down to the internal os.  All instruments were then retired. The patient  tolerated the procedure well and was transported to the recovery room in  satisfactory condition.      Charles A. Clearance Coots, M.D.  Electronically Signed     CAH/MEDQ  D:  12/07/2006  T:  12/07/2006  Job:  [id number]   cc:   Renaye Rakers, M.D.  Fax: [phone]

## 2010-07-03 ENCOUNTER — Ambulatory Visit (HOSPITAL_COMMUNITY)
Admission: RE | Admit: 2010-07-03 | Discharge: 2010-07-03 | Disposition: A | Discharge status: Home / Self Care | Payer: Self-pay | Source: Ambulatory Visit | Attending: Family Medicine | Admitting: Family Medicine

## 2010-07-03 ENCOUNTER — Other Ambulatory Visit: Payer: Self-pay | Admitting: Family Medicine

## 2010-07-03 DIAGNOSIS — M79645 Pain in left finger(s): Secondary | ICD-10-CM

## 2010-07-03 DIAGNOSIS — M25549 Pain in joints of unspecified hand: Secondary | ICD-10-CM | POA: Insufficient documentation

## 2010-07-03 IMAGING — CR DG HAND COMPLETE 3+V*L*
3 series · 3 of 3 positions shown · non-contrast
Comparison: None.

CLINICAL DATA: Pain in the left hand, fourth and fifth digits.

LEFT HAND - COMPLETE 3+ VIEW

[x hand pa left]
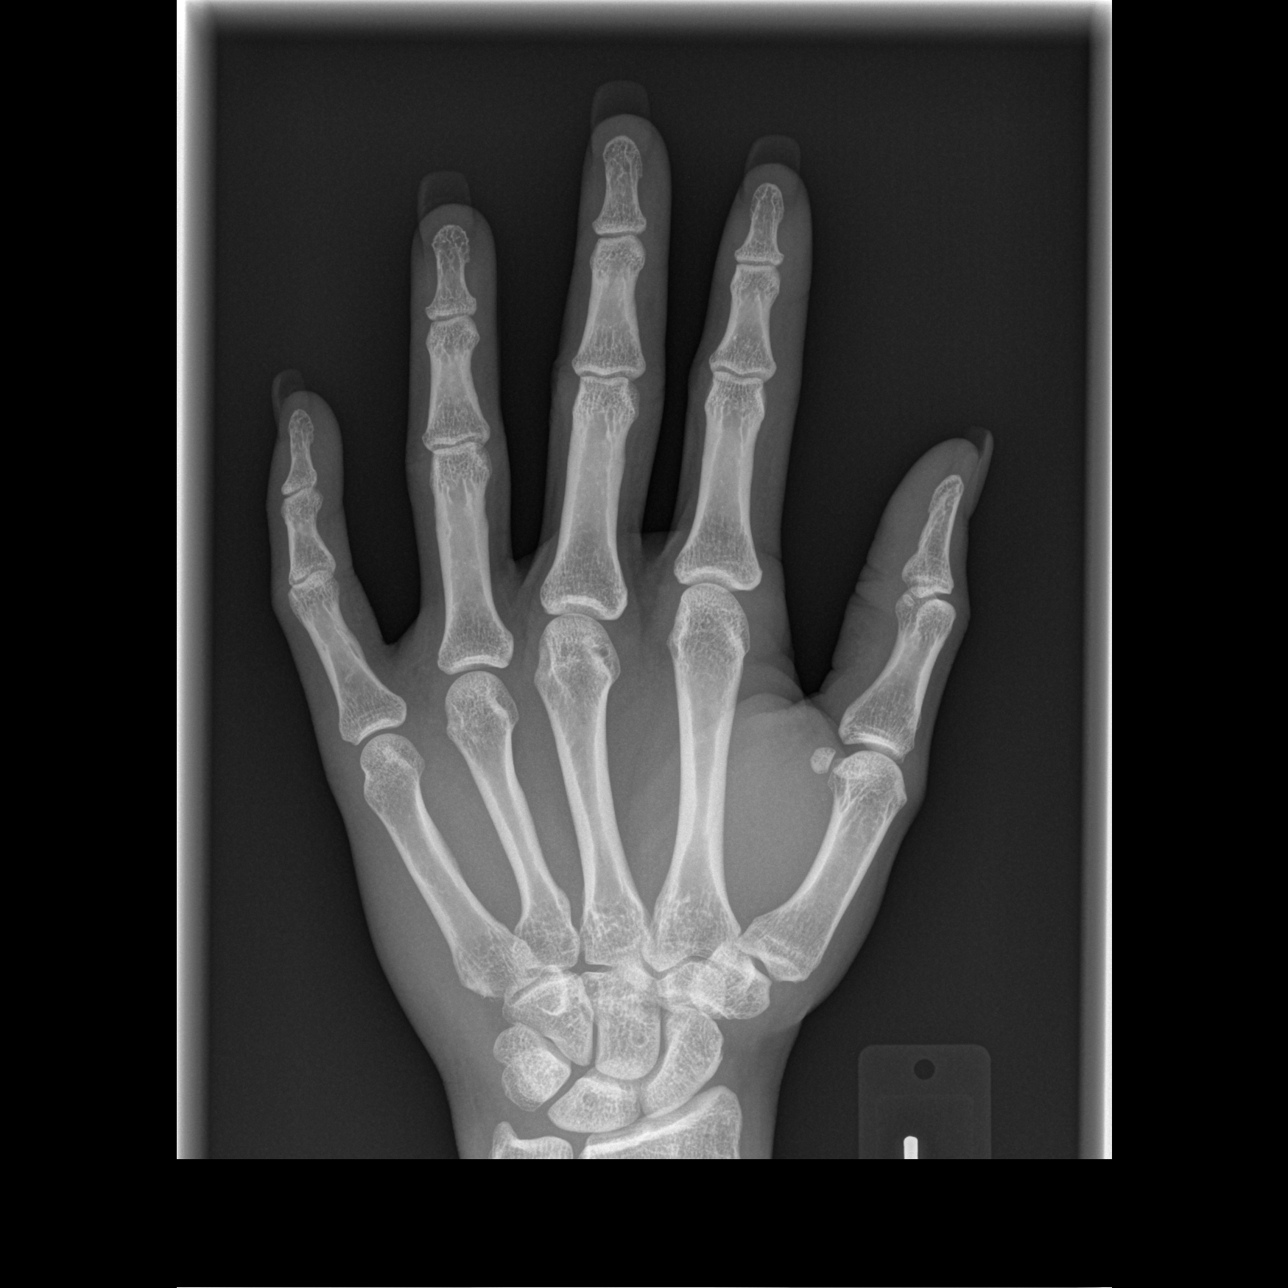

[x hand oblique left]
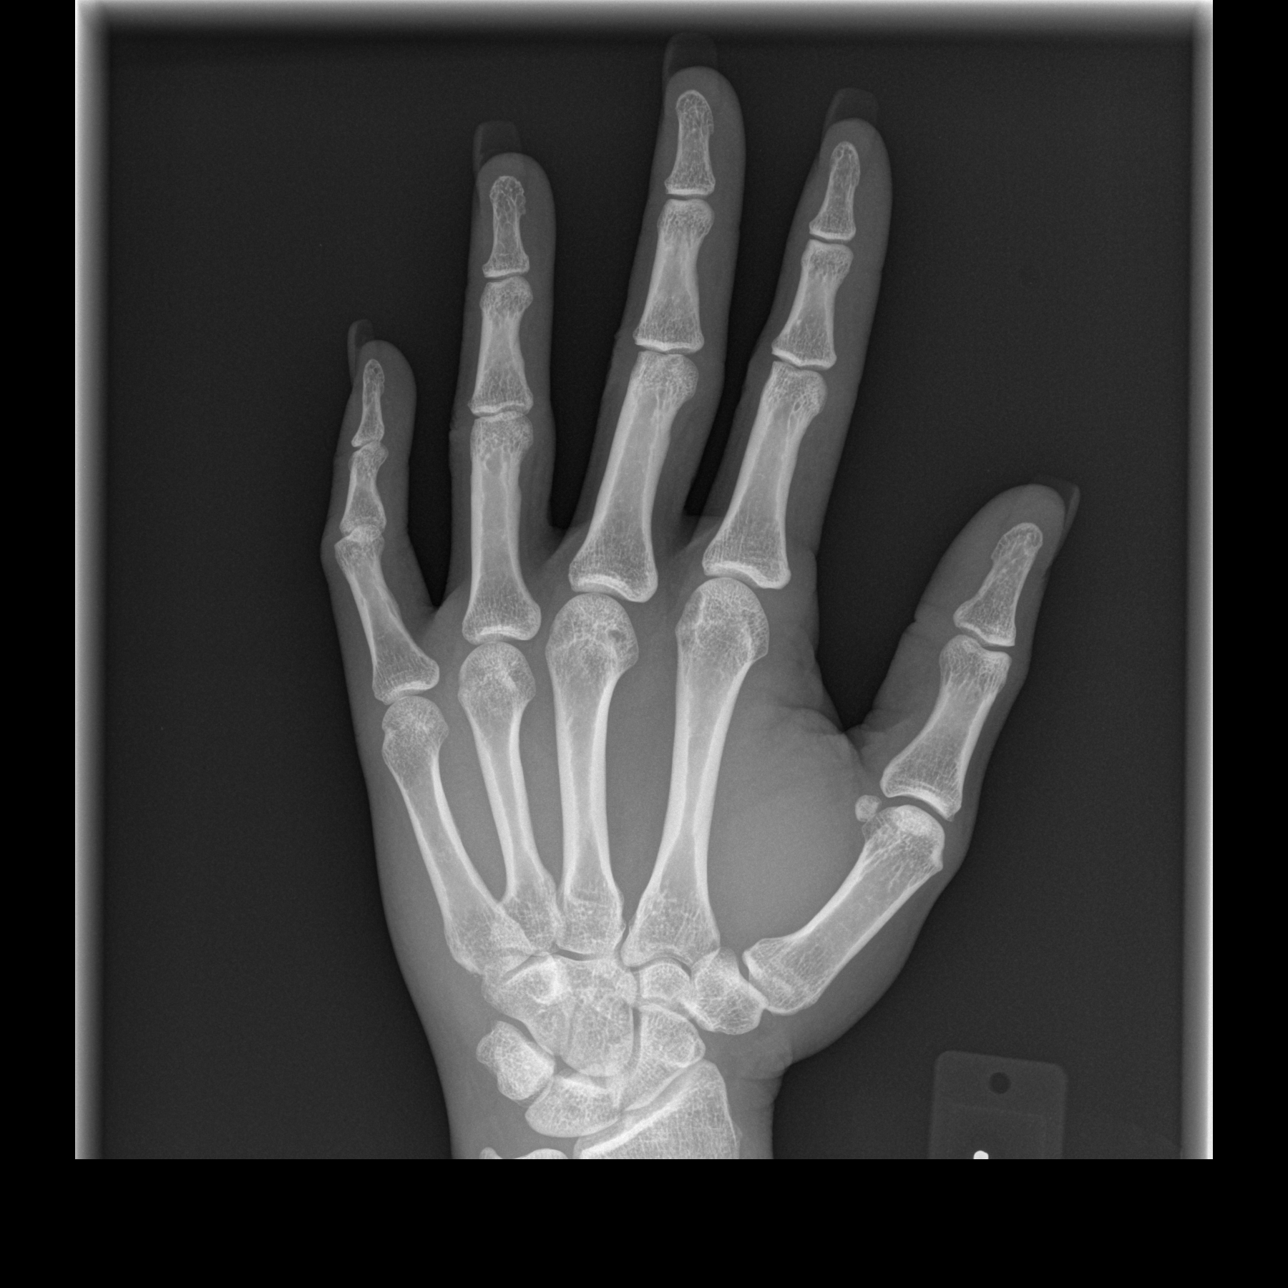

[x hand lat left]
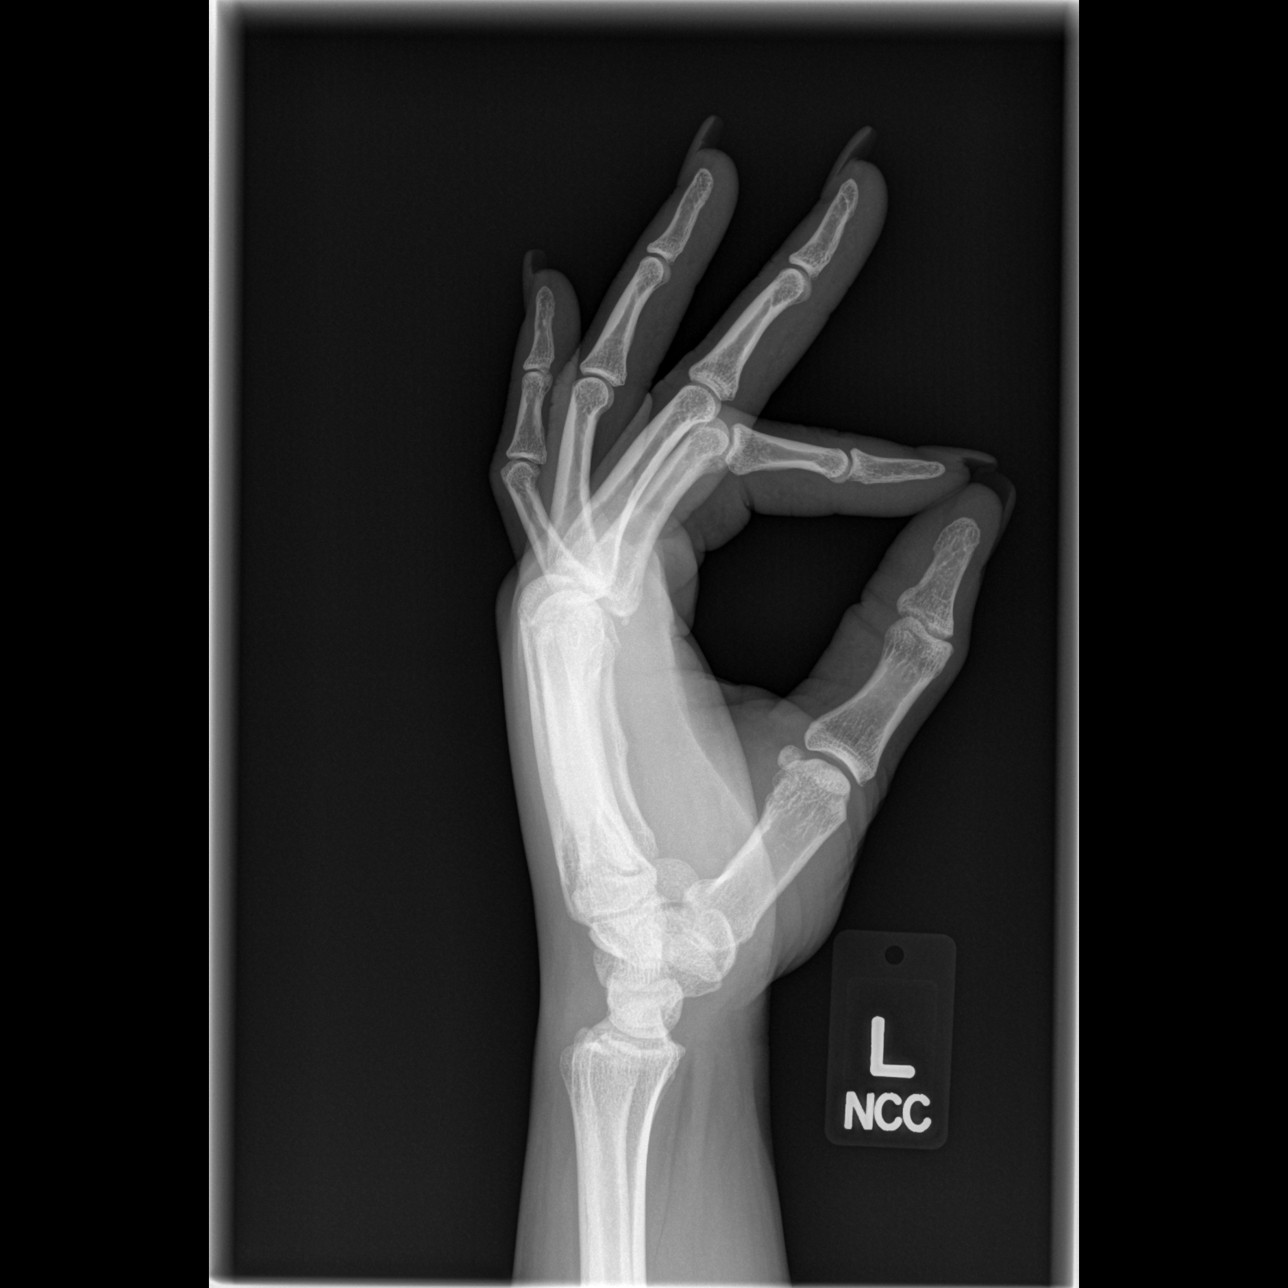

[3 of 3 positions shown; findings below may reference images not displayed]

FINDINGS: Three views of the left hand were obtained.  Alignment of
the left hand is normal.  No evidence for acute fracture or
dislocation.  No gross soft tissue abnormality.
IMPRESSION: No acute osseous abnormality to the left hand.

## 2010-08-05 ENCOUNTER — Other Ambulatory Visit: Payer: Self-pay | Admitting: Obstetrics

## 2010-08-05 DIAGNOSIS — N83209 Unspecified ovarian cyst, unspecified side: Secondary | ICD-10-CM

## 2010-08-05 DIAGNOSIS — R102 Pelvic and perineal pain: Secondary | ICD-10-CM

## 2010-09-01 ENCOUNTER — Ambulatory Visit (HOSPITAL_COMMUNITY): Payer: Self-pay

## 2010-09-03 ENCOUNTER — Ambulatory Visit (HOSPITAL_COMMUNITY)
Admission: RE | Admit: 2010-09-03 | Discharge: 2010-09-03 | Disposition: A | Discharge status: Home / Self Care | Payer: Self-pay | Source: Ambulatory Visit | Attending: Obstetrics | Admitting: Obstetrics

## 2010-09-03 DIAGNOSIS — D251 Intramural leiomyoma of uterus: Secondary | ICD-10-CM | POA: Insufficient documentation

## 2010-09-03 DIAGNOSIS — R102 Pelvic and perineal pain: Secondary | ICD-10-CM

## 2010-09-03 DIAGNOSIS — N83209 Unspecified ovarian cyst, unspecified side: Secondary | ICD-10-CM | POA: Insufficient documentation

## 2010-09-03 DIAGNOSIS — N949 Unspecified condition associated with female genital organs and menstrual cycle: Secondary | ICD-10-CM | POA: Insufficient documentation

## 2010-09-03 IMAGING — US US TRANSVAGINAL NON-OB
2 series · 13 of 25 positions shown · non-contrast
Comparison: [DATE] CT, ultrasound [DATE]

CLINICAL DATA: Pelvic pain.  Follow-up ovarian cyst

TRANSABDOMINAL AND TRANSVAGINAL ULTRASOUND OF PELVIS
TECHNIQUE: Both transabdominal and transvaginal ultrasound
examinations of the pelvis were performed. Transabdominal technique
was performed for global imaging of the pelvis including uterus,
ovaries, adnexal regions, and pelvic cul-de-sac.

[Series 1: us pelvis complete · 7 acquisitions, 1 frame shown (1 of 2)]
[im 1/7]
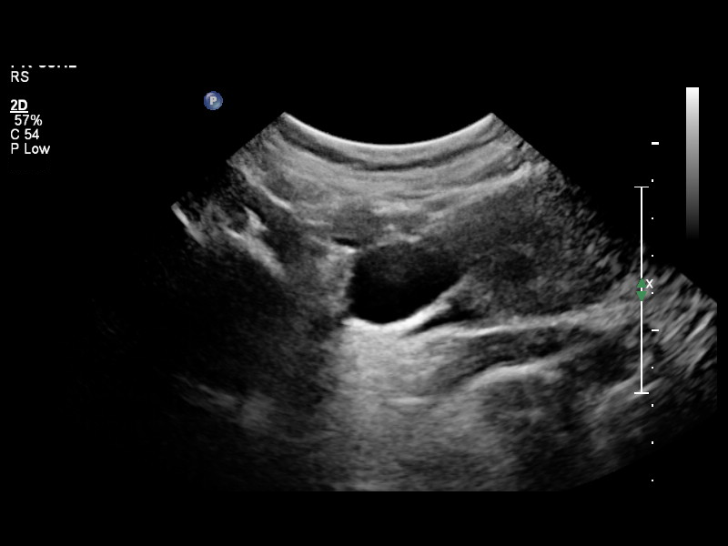

[Series 1: us pelvis complete · 103 acquisitions, 12 frames shown (2 of 2)]
[im 1/103]
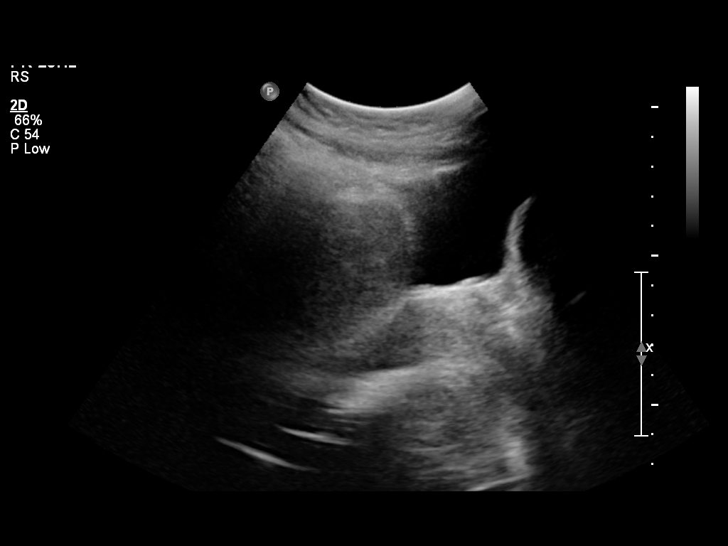
[im 10/103]
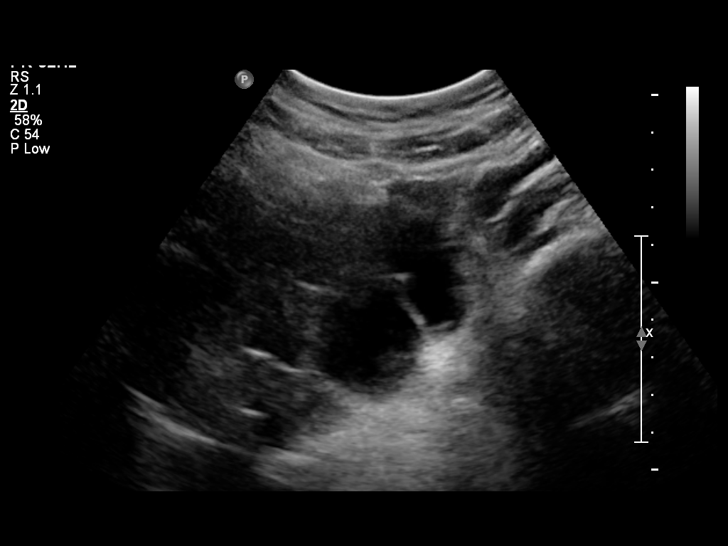
[im 19/103]
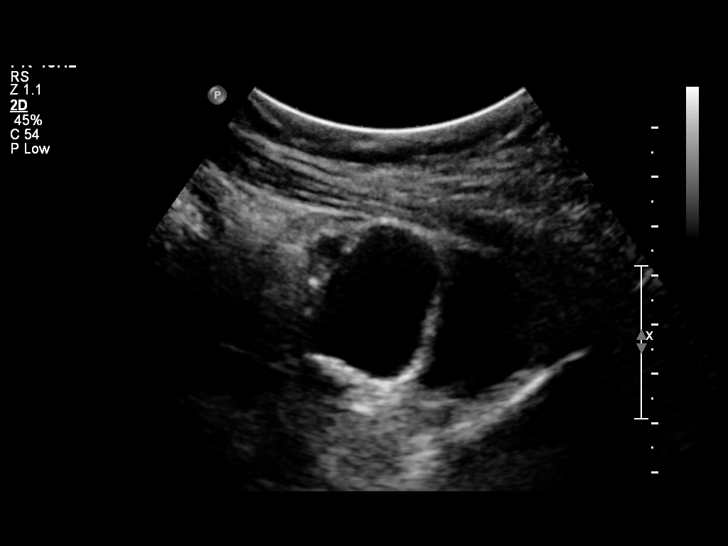
[im 28/103]
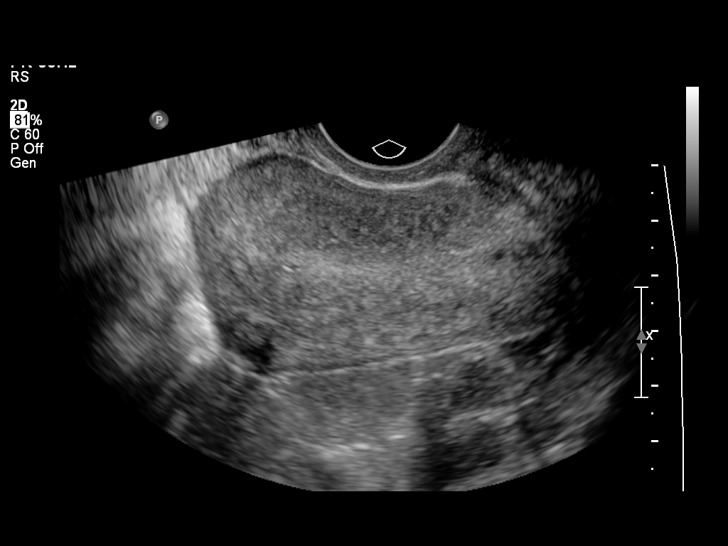
[im 38/103]
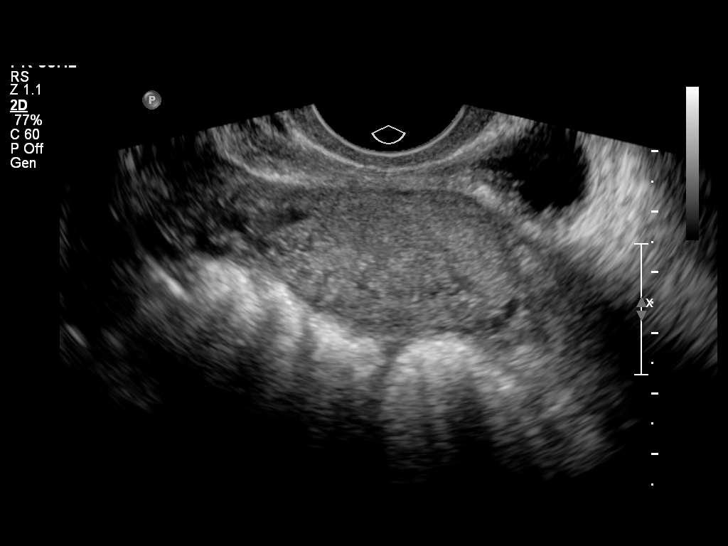
[im 47/103]
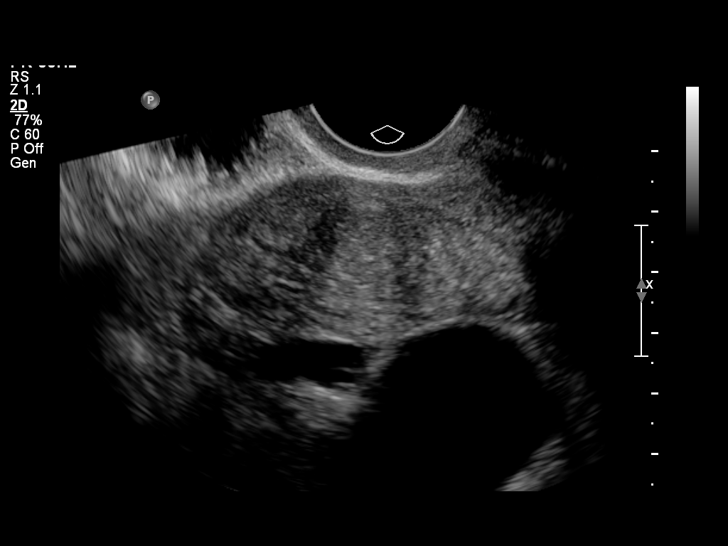
[im 56/103]
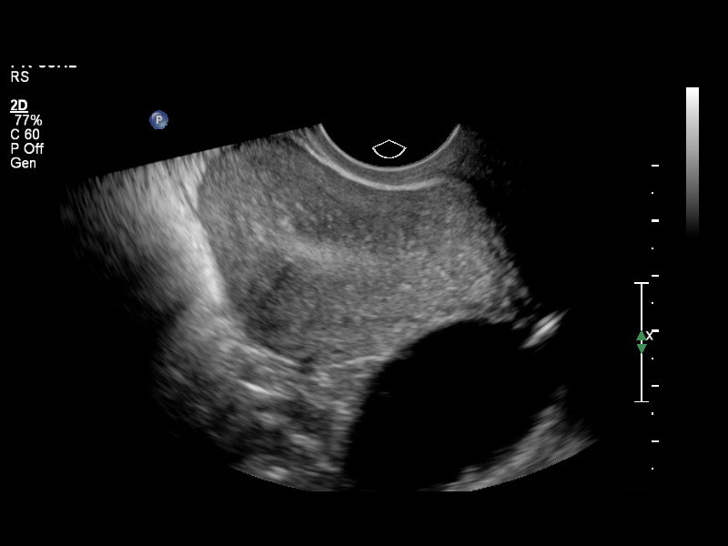
[im 65/103]
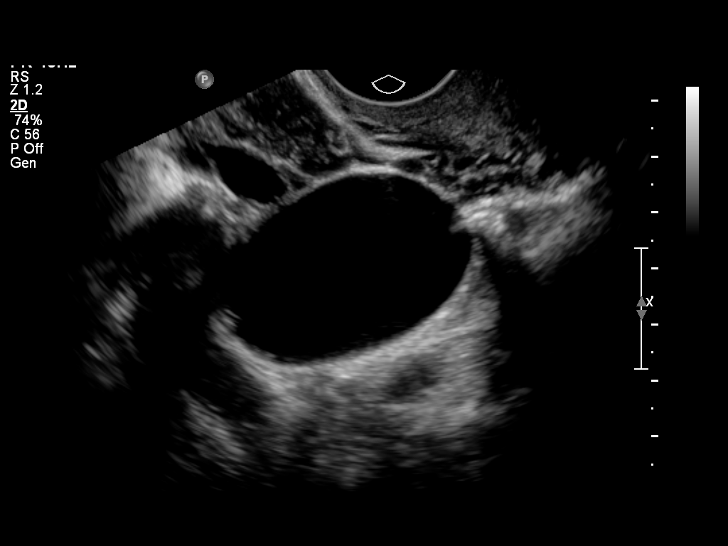
[im 75/103]
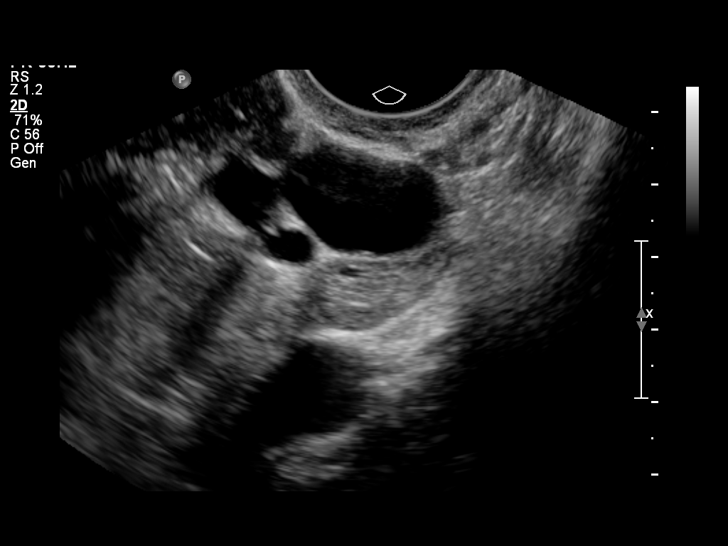
[im 84/103]
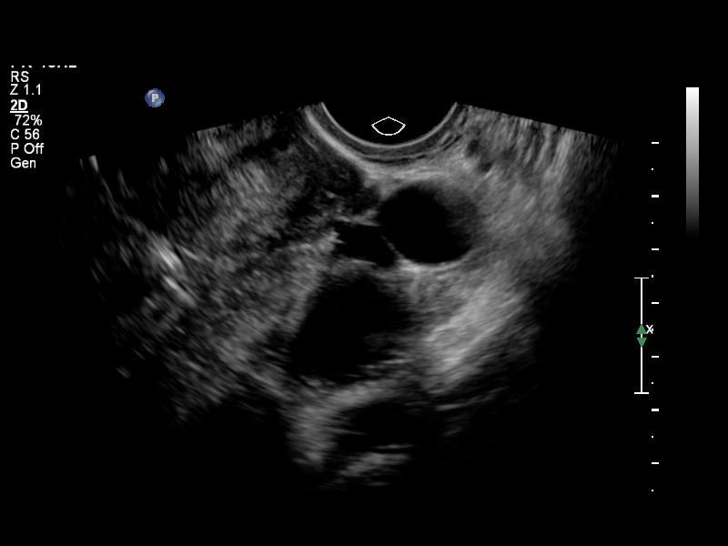
[im 93/103]
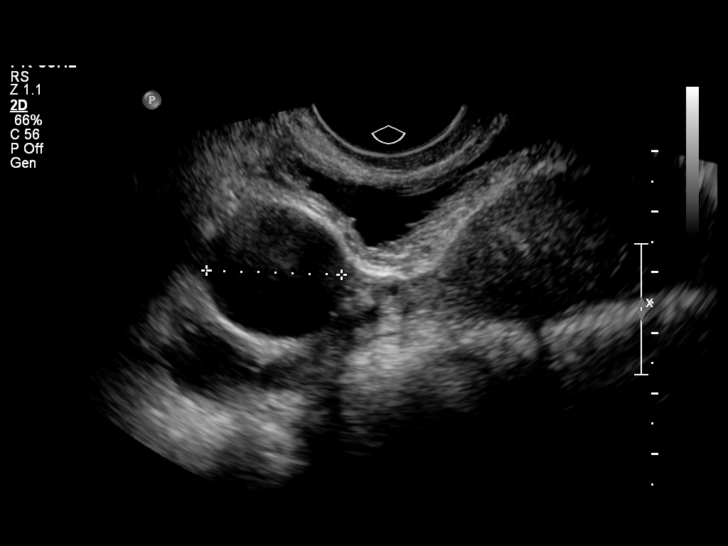
[im 103/103]
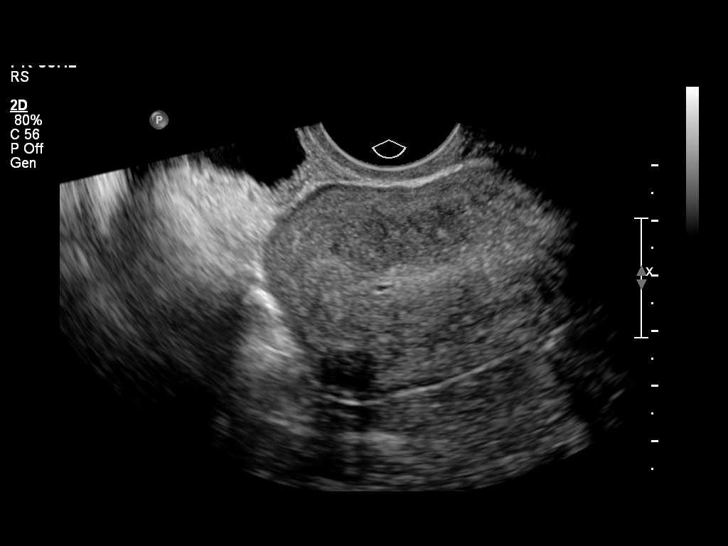

[13 of 25 positions shown; findings below may reference images not displayed]

It was necessary to proceed with endovaginal exam following the
transabdominal exam to visualize the right ovary and endometrium.
FINDINGS: Uterus: Anteverted, anteflexed.  Normal in size.  Fibroids again
noted, as follows:

Left lateral uterine fundus/body, intramural, 1.5 x 1.5 x 1.4 cm

Posterior uterine body, intramural, 1.2 x 1.0 x 1.0 cm

Endometrium: Normal in thickness and appearance

Right ovary:  The ovary is normal in size and appearance.  Simple
appearing probable paraovarian cysts are again noted, largest 2.5 x
2.2 x 2.2 cm.

Left ovary: The left ovary is normal in size and appearance.  Left
probable paraovarian simple cysts again noted, largest 4.1 x 3.4 x
3.3 cm.  Possible small left hydrosalpinx incidentally noted.

Compared to the prior exams, there is no significant change.

Other findings: No free fluid
IMPRESSION: Stable intramural uterine fibroids as above.

Bilateral simple appearing paraovarian cysts are stable allowing
for differences. This is almost certainly benign, and no specific
imaging follow up is recommended according to the Society of
Radiologists in Ultrasound [Q0] Consensus  Conference Statement (TAIPI
TAIPI et al. Management of Asymptomatic Ovarian and Other Adnexal
Cysts Imaged at US:  Society of Radiologists in Ultrasound

.

## 2010-11-02 LAB — CBC
Hemoglobin: 13.4
RBC: 5.12 — ABNORMAL HIGH
RDW: 18.2 — ABNORMAL HIGH
WBC: 10.2

## 2010-11-02 LAB — BASIC METABOLIC PANEL
Calcium: 9.4
GFR calc Af Amer: >60
GFR calc non Af Amer: >60
Sodium: 136

## 2015-02-18 ENCOUNTER — Emergency Department (INDEPENDENT_AMBULATORY_CARE_PROVIDER_SITE_OTHER)
Admission: EM | Admit: 2015-02-18 | Discharge: 2015-02-18 | Disposition: A | Discharge status: Home / Self Care | Payer: Self-pay | Source: Home / Self Care | Attending: Family Medicine | Admitting: Family Medicine

## 2015-02-18 ENCOUNTER — Encounter (HOSPITAL_COMMUNITY): Payer: Self-pay | Admitting: *Deleted

## 2015-02-18 DIAGNOSIS — H109 Unspecified conjunctivitis: Secondary | ICD-10-CM

## 2015-02-18 DIAGNOSIS — J069 Acute upper respiratory infection, unspecified: Secondary | ICD-10-CM

## 2015-02-18 HISTORY — DX: Essential (primary) hypertension: I10

## 2015-02-18 MED ORDER — IPRATROPIUM BROMIDE 0.06 % NA SOLN: 2.0000 | Freq: Four times a day (QID) | NASAL | Status: DC

## 2015-02-18 MED ORDER — TOBRAMYCIN 0.3 % OP SOLN: 1.0000 [drp] | Freq: Four times a day (QID) | OPHTHALMIC | Status: DC

## 2015-02-18 NOTE — ED Provider Notes (Signed)
CSN: [id number]     Arrival date & time 02/18/15  1902 History   First MD Initiated Contact with Patient 02/18/15 2050     Chief Complaint  Patient presents with  . Eye Problem   (Consider location/radiation/quality/duration/timing/severity/associated sxs/prior Treatment) Patient is a 50 y.o. female presenting with eye problem. The history is provided by the patient.  Eye Problem Location:  Both Quality:  Aching and dull Severity:  Mild Onset quality:  Gradual Duration:  3 days Progression:  Unchanged Chronicity:  New Context: not contact lens problem   Context comment:  Recent uri sx with eye redness, Relieved by:  None tried Worsened by:  Nothing tried Ineffective treatments:  None tried Associated symptoms: crusting, discharge and redness   Associated symptoms: no decreased vision, no double vision and no photophobia   Risk factors: recent URI     Past Medical History  Diagnosis Date  . Hypertension    Past Surgical History  Procedure Laterality Date  . Btl     History reviewed. No pertinent family history. Social History  Substance Use Topics  . Smoking status: Never Smoker   . Smokeless tobacco: None  . Alcohol Use: No   OB History    No data available     Review of Systems  Constitutional: Negative.  Negative for fever.  HENT: Positive for congestion and postnasal drip.   Eyes: Positive for discharge and redness. Negative for double vision, photophobia, pain and visual disturbance.  Respiratory: Positive for cough.   All other systems reviewed and are negative.   Allergies  Codeine  Home Medications   Prior to Admission medications   Medication Sig Start Date End Date Taking? Authorizing Provider  Pseudoeph-Doxylamine-DM-APAP (NYQUIL PO) Take by mouth.   Yes Historical Provider, MD  Valsartan (DIOVAN PO) Take by mouth.   Yes Historical Provider, MD   Meds Ordered and Administered this Visit  Medications - No data to display  BP 127/63 mmHg   Pulse 58  Temp(Src) 97.9 F (36.6 C) (Oral)  Resp 17  SpO2 98% No data found.   Physical Exam  Constitutional: She is oriented to person, place, and time. She appears well-developed and well-nourished. No distress.  HENT:  Right Ear: External ear normal.  Left Ear: External ear normal.  Nose: Mucosal edema and rhinorrhea present.  Mouth/Throat: Oropharynx is clear and moist.  Eyes: EOM are normal. Pupils are equal, round, and reactive to light. Right eye exhibits discharge. Left eye exhibits discharge. Right conjunctiva is injected. Left conjunctiva is injected.  Neck: Normal range of motion. Neck supple.  Cardiovascular: Normal rate, regular rhythm and normal heart sounds.   Pulmonary/Chest: Effort normal and breath sounds normal.  Lymphadenopathy:    She has no cervical adenopathy.  Neurological: She is alert and oriented to person, place, and time.  Skin: Skin is warm and dry.  Nursing note and vitals reviewed.   ED Course  Procedures (including critical care time)  Labs Review Labs Reviewed - No data to display  Imaging Review No results found.   Visual Acuity Review  Right Eye Distance:   Left Eye Distance:   Bilateral Distance:    Right Eye Near:   Left Eye Near:    Bilateral Near:         MDM  No diagnosis found. Meds ordered this encounter  Medications  . Valsartan (DIOVAN PO)    Sig: Take by mouth.  . Pseudoeph-Doxylamine-DM-APAP (NYQUIL PO)    Sig: Take by mouth.  Marland Kitchen  ipratropium (ATROVENT) 0.06 % nasal spray    Sig: Place 2 sprays into both nostrils 4 (four) times daily.    Dispense:  15 mL    Refill:  1  . tobramycin (TOBREX) 0.3 % ophthalmic solution    Sig: Place 1 drop into both eyes every 6 (six) hours.    Dispense:  5 mL    Refill:  [place], MD 02/18/15 2109

## 2015-02-18 NOTE — ED Notes (Signed)
Pt  Has  Symptoms  Of  Irritation of both  Eyes  As   Well as sinus  Drainage  Swollen glands    And fever    pt  Reports  The  Symptoms   For  Several  Days

## 2015-08-31 IMAGING — US US ART/VEN ABD/PELV/SCROTUM DOPPLER LTD
1 series · 13 of 25 positions shown · non-contrast
Comparison: CT scan from earlier today. Ultrasound exam from
[DATE].

CLINICAL DATA: Cystic lesion left adnexal space on recent CT scan.

EXAM:
TRANSABDOMINAL AND TRANSVAGINAL ULTRASOUND OF PELVIS
DOPPLER ULTRASOUND OF OVARIES
TECHNIQUE: Both transabdominal and transvaginal ultrasound examinations of the
pelvis were performed. Transabdominal technique was performed for
global imaging of the pelvis including uterus, ovaries, adnexal
regions, and pelvic cul-de-sac.
It was necessary to proceed with endovaginal exam following the
transabdominal exam to visualize the left adnexal region. Color and
duplex Doppler ultrasound was utilized to evaluate blood flow to the
ovaries.

[Series 1: us art/ven abd/pelv/scrotum doppler ltd · 0.22mm/px · 13 of 199 slices shown]
[im 1/199]
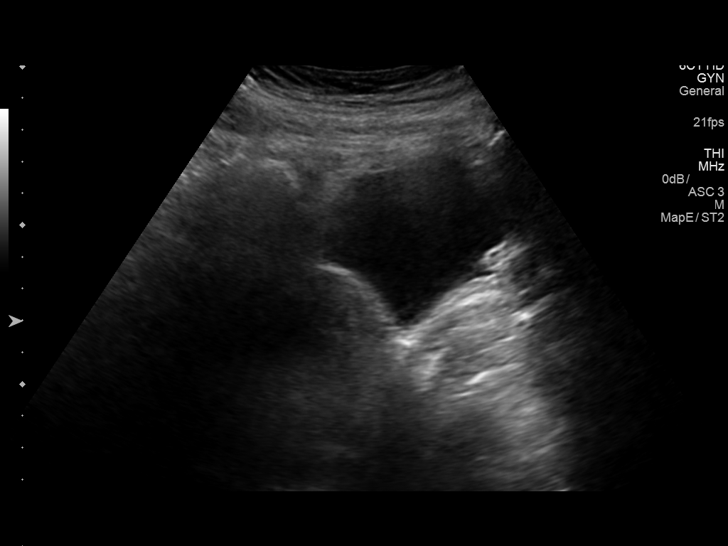
[im 17/199]
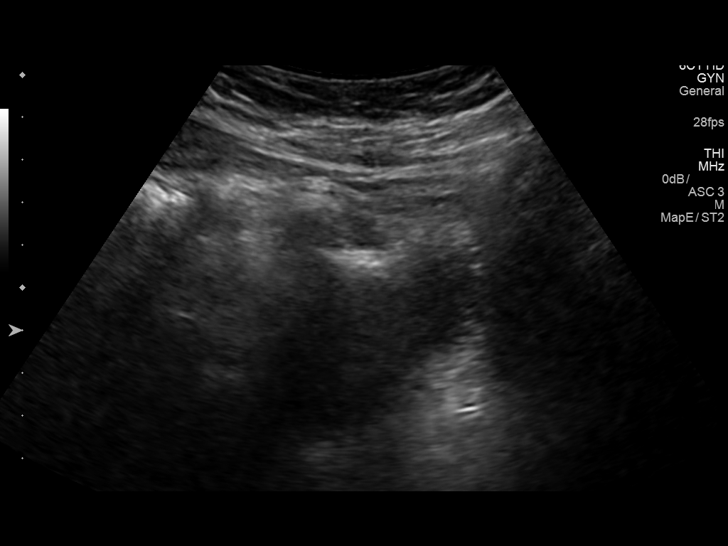
[im 34/199]
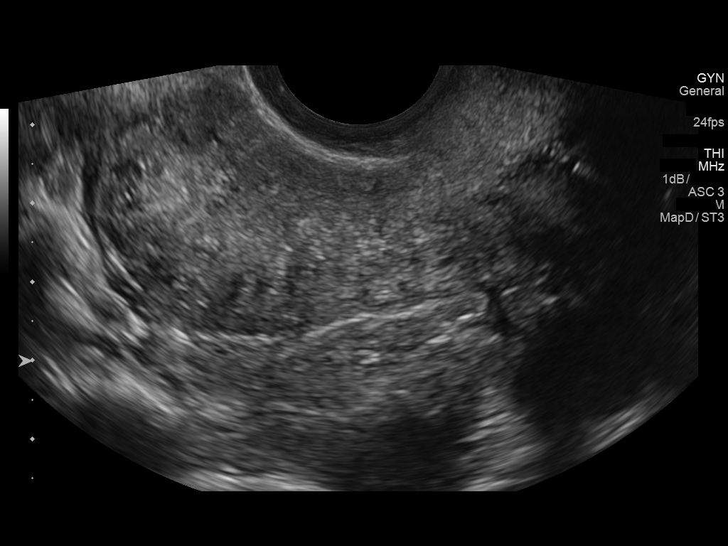
[im 50/199]
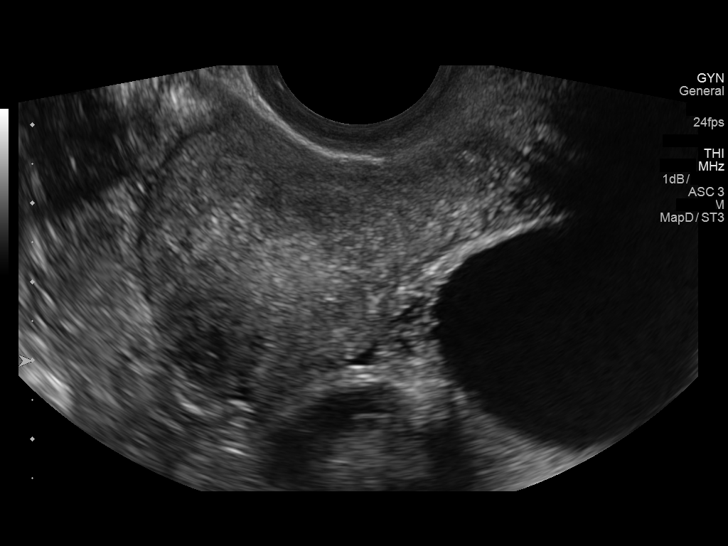
[im 67/199]
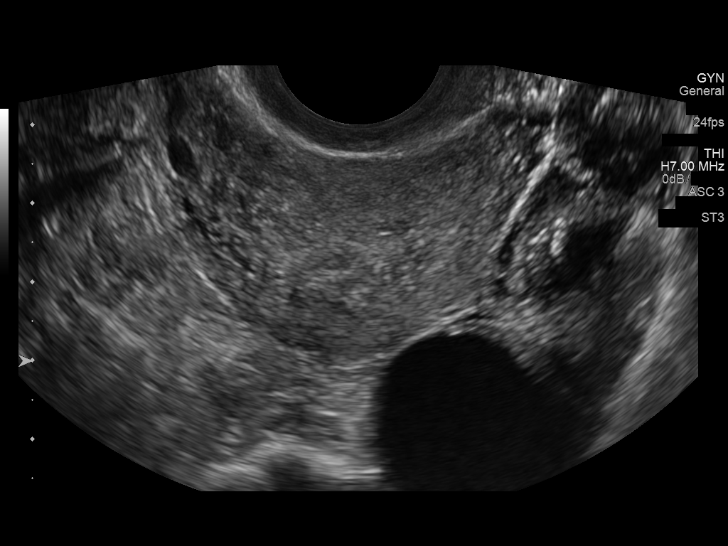
[im 83/199]
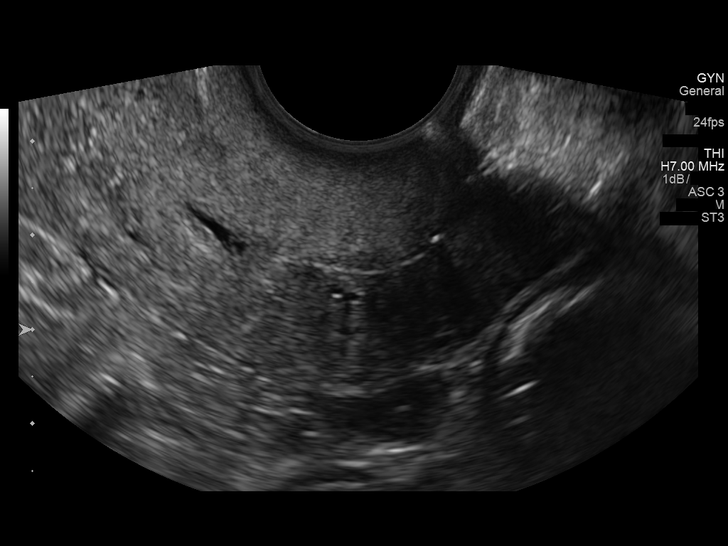
[im 100/199]
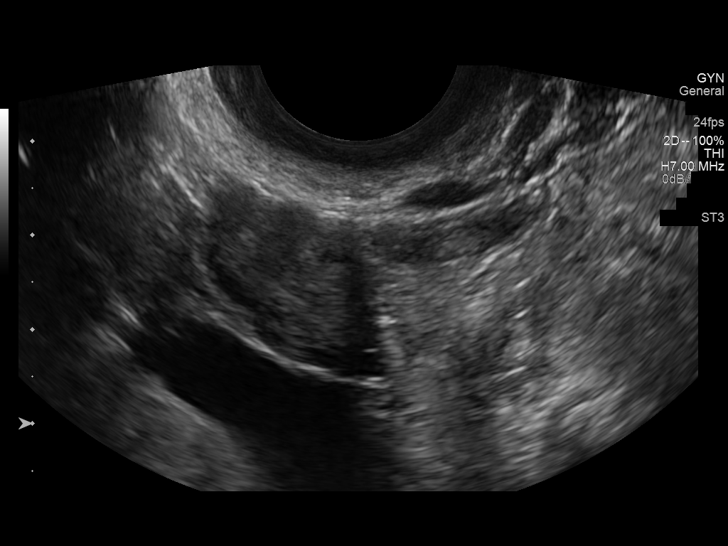
[im 116/199]
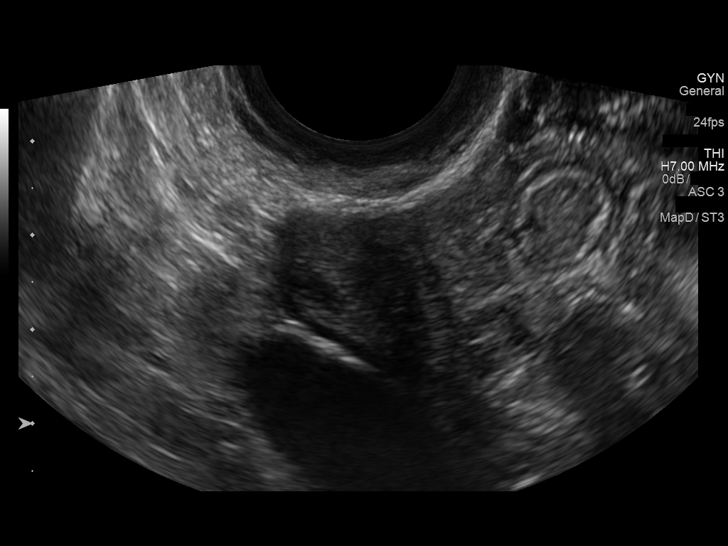
[im 133/199]
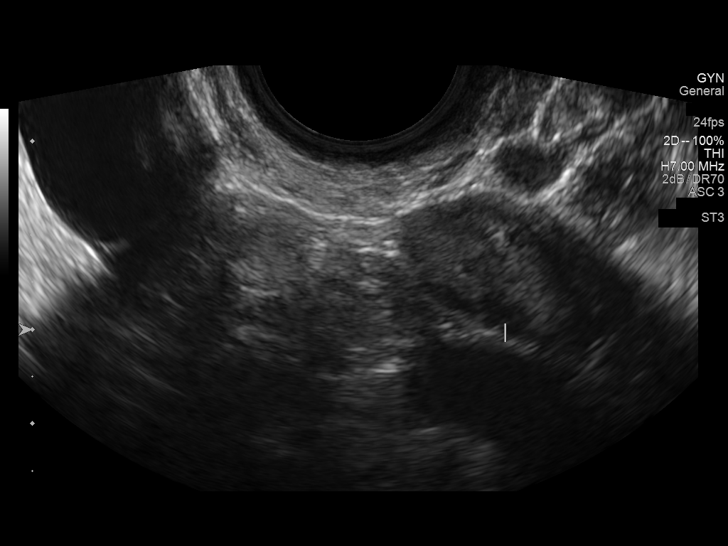
[im 149/199]
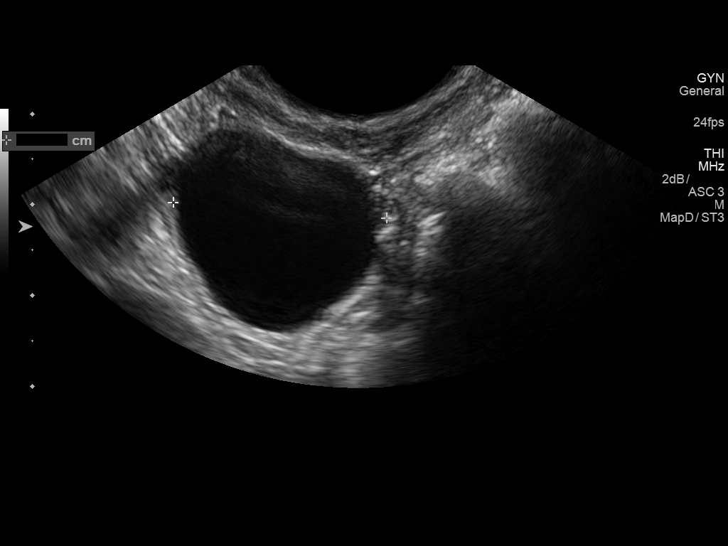
[im 166/199]
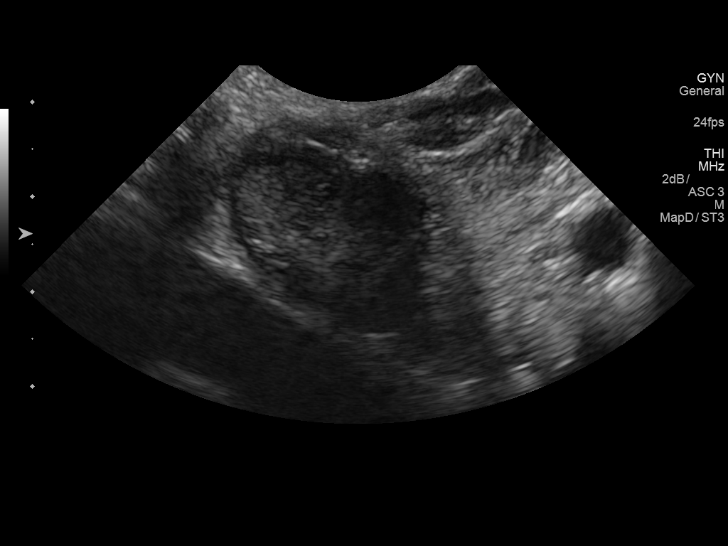
[im 182/199]
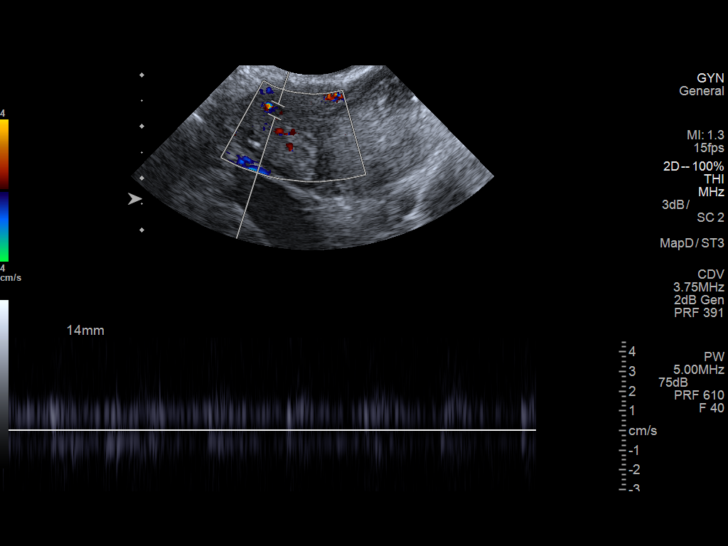
[im 199/199]
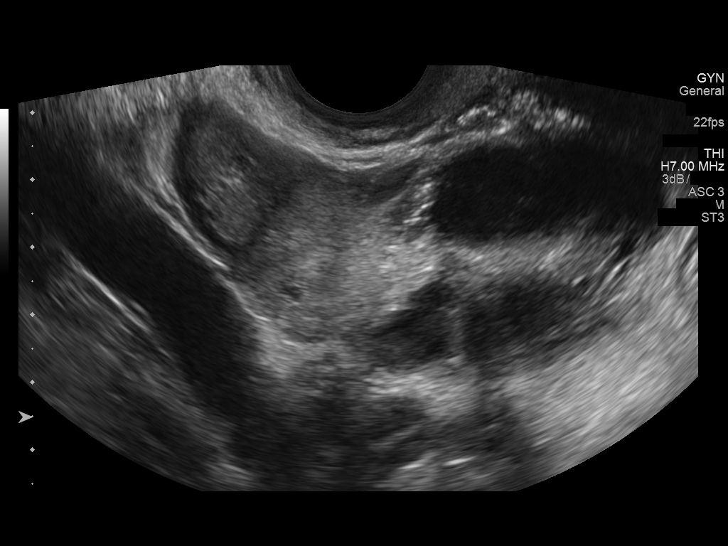

[13 of 25 positions shown; findings below may reference images not displayed]

FINDINGS: Uterus

Measurements: 7.4 x 3.8 x 4.6 cm. Intramural fibroids identified
measuring up to 1.7 cm

Endometrium

Thickness: 1 mm.  No focal abnormality visualized.

Right ovary

Measurements: 2.5 x 1.4 x 1.2 cm 3.0 x 2.7 cm. Small simple
appearing cystic structure measuring 3 x 1.9 x 2.4 cm identified
adjacent to the right ovary..

Left ovary

Measurements: 2.4 x 1.8 x 1.7 cm. There is a large cystic lesion in
the left adnexal space as seen on recent CT. Sonographer reports
then real-time the tubular cystic areas appear to communicate,
suggesting dilated fallopian tube.

Pulsed Doppler evaluation of both ovaries demonstrates normal
low-resistance arterial and venous waveforms.

Other findings

No abnormal free fluid.
IMPRESSION: 1. Exam is stable since [DATE].
[DATE]. [DATE] cm benign appearing extra ovarian cystic lesion in the right
adnexal space is stable since [DATE] compatible with stable
paraovarian cyst.
3. Extra ovarian multi cystic lesion in the left adnexal space has
also been previously documented by ultrasound with imaging features
not substantially changed since [DATE]. This may be multiple
paraovarian cysts or a dilated fluid-filled fallopian tube.

## 2015-10-04 ENCOUNTER — Encounter (HOSPITAL_COMMUNITY): Payer: Self-pay | Admitting: Oncology

## 2015-10-04 ENCOUNTER — Emergency Department (HOSPITAL_COMMUNITY)
Admission: EM | Admit: 2015-10-04 | Discharge: 2015-10-04 | Disposition: A | Discharge status: Home / Self Care | Payer: Self-pay | Attending: Emergency Medicine | Admitting: Emergency Medicine

## 2015-10-04 ENCOUNTER — Emergency Department (HOSPITAL_COMMUNITY): Payer: Self-pay

## 2015-10-04 DIAGNOSIS — I10 Essential (primary) hypertension: Secondary | ICD-10-CM | POA: Insufficient documentation

## 2015-10-04 DIAGNOSIS — N760 Acute vaginitis: Secondary | ICD-10-CM | POA: Insufficient documentation

## 2015-10-04 DIAGNOSIS — Z79899 Other long term (current) drug therapy: Secondary | ICD-10-CM | POA: Insufficient documentation

## 2015-10-04 DIAGNOSIS — B9689 Other specified bacterial agents as the cause of diseases classified elsewhere: Secondary | ICD-10-CM

## 2015-10-04 DIAGNOSIS — R102 Pelvic and perineal pain: Secondary | ICD-10-CM

## 2015-10-04 DIAGNOSIS — R109 Unspecified abdominal pain: Secondary | ICD-10-CM

## 2015-10-04 DIAGNOSIS — N83202 Unspecified ovarian cyst, left side: Secondary | ICD-10-CM | POA: Insufficient documentation

## 2015-10-04 LAB — BASIC METABOLIC PANEL
Anion gap: 8 (ref 5–15)
BUN: 12 mg/dL (ref 6–20)
CALCIUM: 9.2 mg/dL (ref 8.9–10.3)
CO2: 30 mmol/L (ref 22–32)
CREATININE: 0.97 mg/dL (ref 0.44–1.00)
Chloride: 99 mmol/L — ABNORMAL LOW (ref 101–111)
Glucose, Bld: 131 mg/dL — ABNORMAL HIGH (ref 65–99)
Potassium: 3.1 mmol/L — ABNORMAL LOW (ref 3.5–5.1)
SODIUM: 137 mmol/L (ref 135–145)

## 2015-10-04 LAB — WET PREP, GENITAL
SPERM: NONE SEEN
Trich, Wet Prep: NONE SEEN
YEAST WET PREP: NONE SEEN

## 2015-10-04 LAB — URINALYSIS, ROUTINE W REFLEX MICROSCOPIC
BILIRUBIN URINE: NEGATIVE
Glucose, UA: NEGATIVE mg/dL
KETONES UR: NEGATIVE mg/dL
Leukocytes, UA: NEGATIVE
NITRITE: NEGATIVE
Protein, ur: 30 mg/dL — AB
SPECIFIC GRAVITY, URINE: 1.024 (ref 1.005–1.030)
pH: 8 (ref 5.0–8.0)

## 2015-10-04 LAB — CBC WITH DIFFERENTIAL/PLATELET
BASOS ABS: 0 10*3/uL (ref 0.0–0.1)
BASOS PCT: 0 %
EOS ABS: 0 10*3/uL (ref 0.0–0.7)
EOS PCT: 0 %
HEMATOCRIT: 37.1 % (ref 36.0–46.0)
Hemoglobin: 12.3 g/dL (ref 12.0–15.0)
LYMPHS PCT: 9 %
Lymphs Abs: 0.6 10*3/uL — ABNORMAL LOW (ref 0.7–4.0)
MCH: 27.2 pg (ref 26.0–34.0)
MCHC: 33.2 g/dL (ref 30.0–36.0)
MCV: 82.1 fL (ref 78.0–100.0)
MONO ABS: 0.1 10*3/uL (ref 0.1–1.0)
MONOS PCT: 2 %
NEUTROS ABS: 5.5 10*3/uL (ref 1.7–7.7)
NEUTROS PCT: 89 %
PLATELETS: 287 10*3/uL (ref 150–400)
RBC: 4.52 MIL/uL (ref 3.87–5.11)
RDW: 14.9 % (ref 11.5–15.5)
WBC: 6.2 10*3/uL (ref 4.0–10.5)

## 2015-10-04 LAB — URINE MICROSCOPIC-ADD ON

## 2015-10-04 IMAGING — CT CT RENAL STONE PROTOCOL
2 of 3 series · 16 of 46 positions shown, 18 images · non-contrast
Comparison: Prior CT from [DATE].

CLINICAL DATA: Initial evaluation for acute left flank pain.

EXAM:
CT ABDOMEN AND PELVIS WITHOUT CONTRAST
TECHNIQUE: Multidetector CT imaging of the abdomen and pelvis was performed
following the standard protocol without IV contrast.

[Series 4: lung · axial · 0.70mm/px · z∈[-183,-61]mm · 13 of 71 slices shown, 15 images]
[im 5/71  soft-tissue]
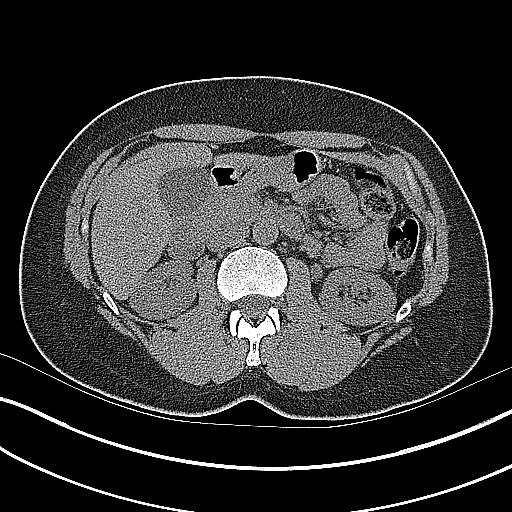
[im 5/71  bone]
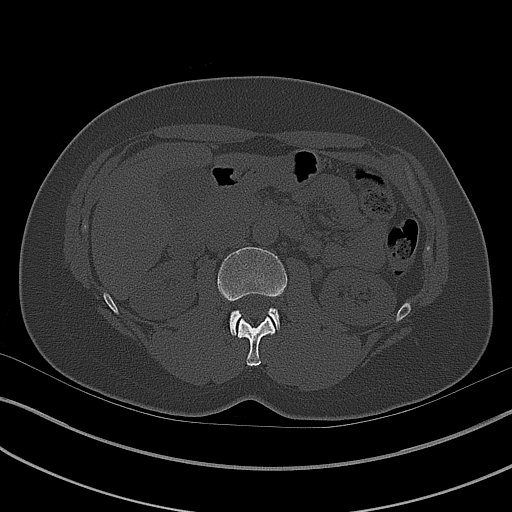
[im 10/71  soft-tissue]
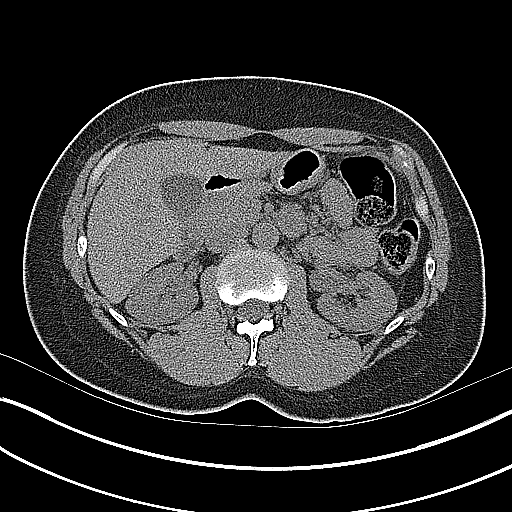
[im 14/71  soft-tissue]
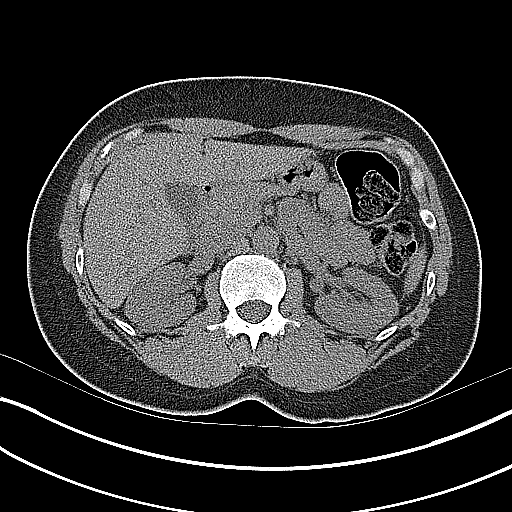
[im 21/71  soft-tissue]
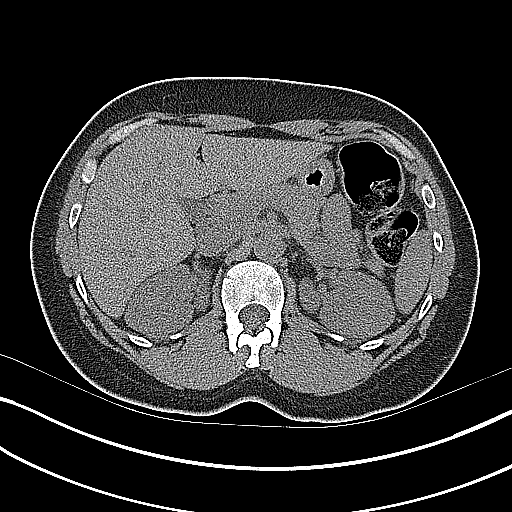
[im 25/71  soft-tissue]
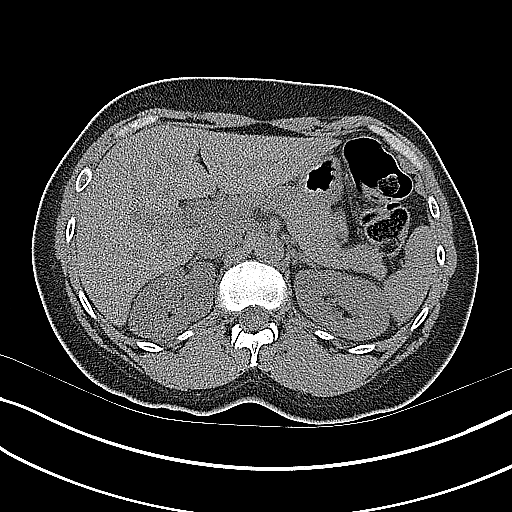
[im 30/71  soft-tissue]
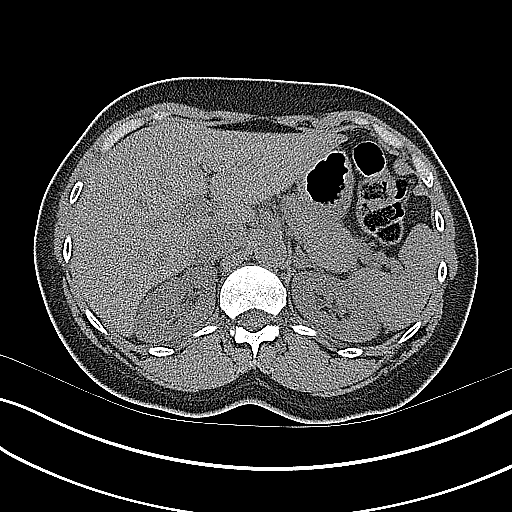
[im 37/71  soft-tissue]
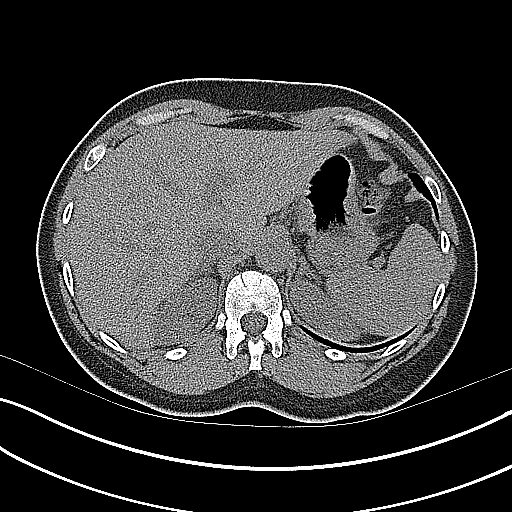
[im 41/71  soft-tissue]
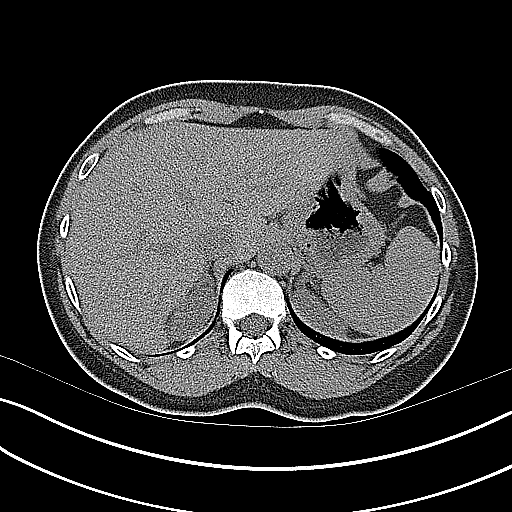
[im 46/71  soft-tissue]
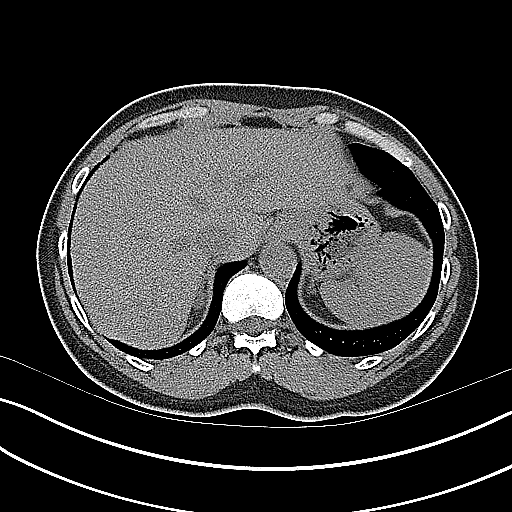
[im 46/71  bone]
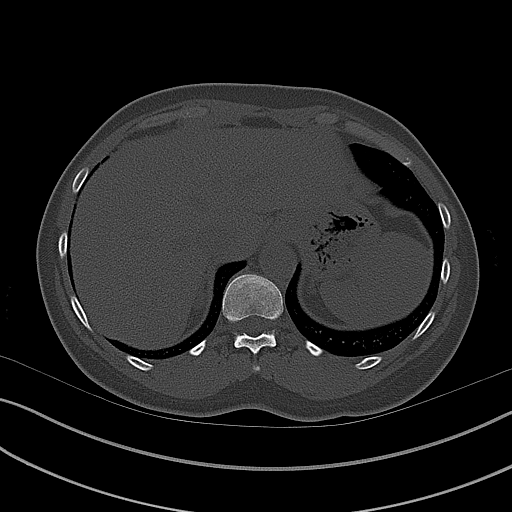
[im 50/71  soft-tissue]
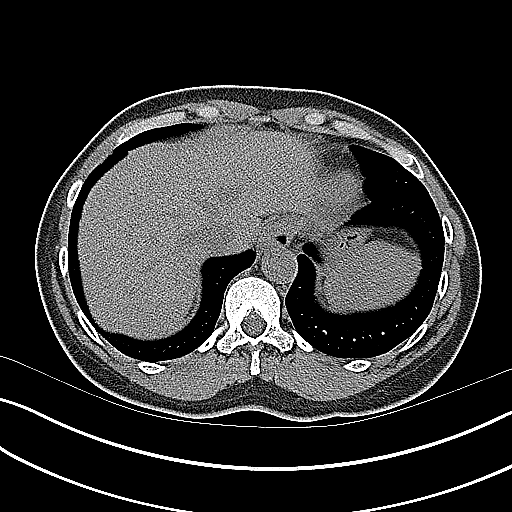
[im 57/71  soft-tissue]
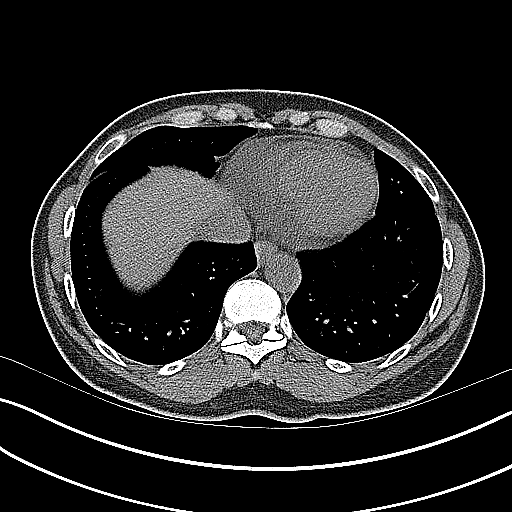
[im 61/71  soft-tissue]
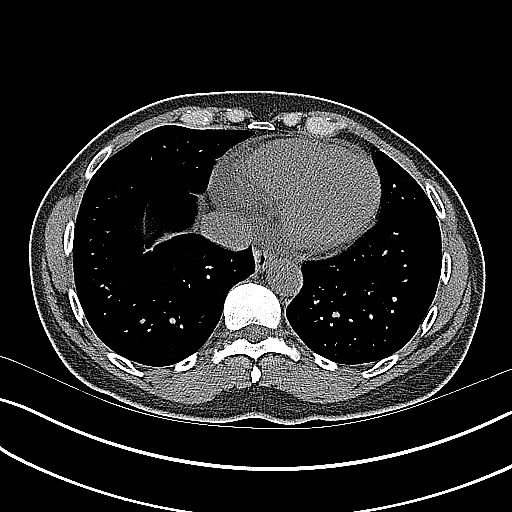
[im 66/71  soft-tissue]
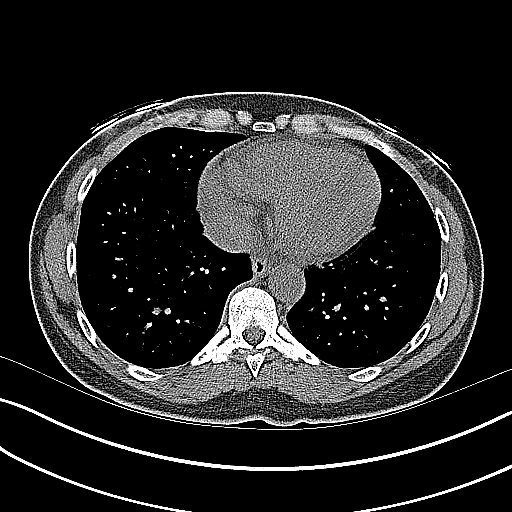

[Series 5: coronal · coronal · 0.73mm/px · 3 of 119 slices shown]
[im 40/119  soft-tissue]
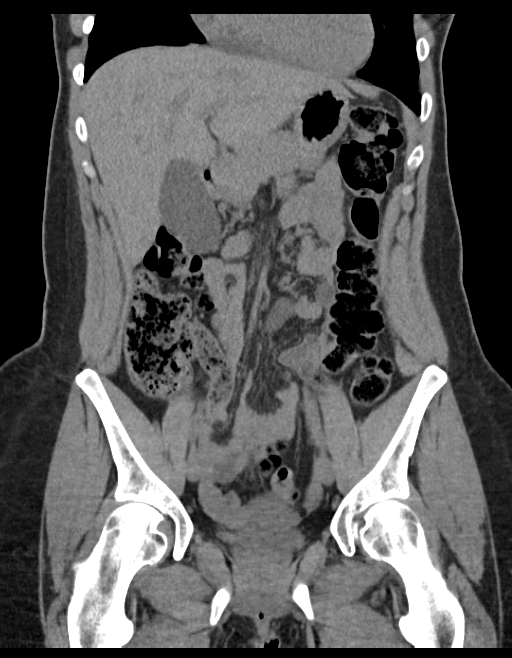
[im 53/119  soft-tissue]
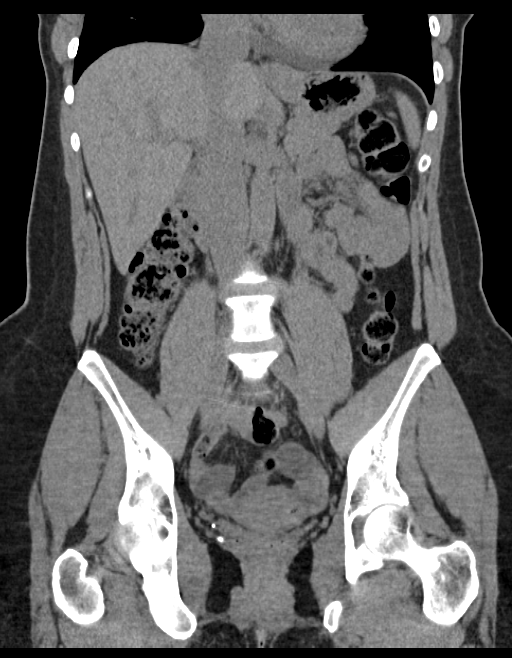
[im 66/119  soft-tissue]
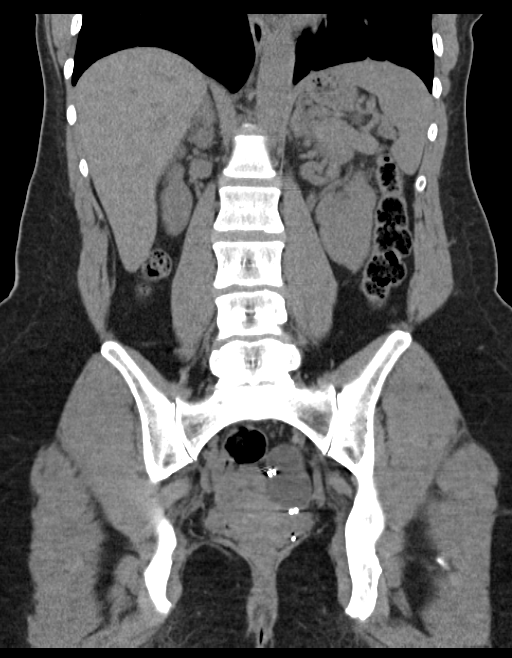

[16 of 46 positions shown; findings below may reference images not displayed]

FINDINGS: Lower chest: Visualized lung bases are clear.

Hepatobiliary: Limited noncontrast evaluation of the liver is
unremarkable. Gallbladder within normal limits. No biliary
dilatation.

Pancreas: Pancreas within normal limits.

Spleen: Spleen within normal limits.

Adrenals/Urinary Tract: Adrenal glands unremarkable. Kidneys equal
in size without evidence of nephrolithiasis or hydronephrosis. No
radiopaque calculi seen along the course of either renal collecting
system. Bladder decompressed without definite abnormality. No
layering stones present within the bladder lumen.

Stomach/Bowel: Probable small hiatal hernia noted. Stomach otherwise
unremarkable. No evidence for bowel obstruction. Appendix within
normal limits. No abnormal wall thickening or inflammatory fat
stranding seen about the bowels.

Vascular/Lymphatic: No pathologically enlarged intra-abdominal or
pelvic lymph nodes identified.

Reproductive: Patient is status post tubal ligation. Uterus grossly
unremarkable. Ovaries within normal limits. 4.4 x 5.2 x 3.2 cm
complex hypodense lesion within the left adnexa, indeterminate,
possibly hydrosalpinx versus multiloculated cystic lesion.

Other: No free air or fluid.

Musculoskeletal: No acute osseous abnormality. No worrisome lytic or
blastic osseous lesions. Moderate degenerative spondylolysis noted
at L5-S1.
IMPRESSION: 1. No CT evidence for nephrolithiasis or obstructive uropathy.
2. 4.4 x 5.2 x 3.2 cm complex hypodense lesion within the left
adnexa. Finding is indeterminate, and may in fact reflect a
hydrosalpinx, but is incompletely evaluated on this exam. Further
evaluation dedicated pelvic ultrasound recommended.
3. No other acute intra-abdominal or pelvic process.

## 2015-10-04 MED ORDER — ONDANSETRON HCL 4 MG PO TABS: 4.0000 mg | ORAL_TABLET | Freq: Four times a day (QID) | ORAL | 0 refills | Status: DC

## 2015-10-04 MED ORDER — MORPHINE SULFATE (PF) 4 MG/ML IV SOLN
4.0000 mg | Freq: Once | INTRAVENOUS | Status: AC
  Administered 2015-10-04: 4 mg via INTRAVENOUS
  Filled 2015-10-04: qty 1

## 2015-10-04 MED ORDER — METRONIDAZOLE 500 MG PO TABS
500.0000 mg | ORAL_TABLET | Freq: Once | ORAL | Status: AC
  Administered 2015-10-04: 500 mg via ORAL
  Filled 2015-10-04: qty 1

## 2015-10-04 MED ORDER — METRONIDAZOLE 500 MG PO TABS: 500.0000 mg | ORAL_TABLET | Freq: Two times a day (BID) | ORAL | 0 refills | Status: DC

## 2015-10-04 MED ORDER — HYDROMORPHONE HCL 1 MG/ML IJ SOLN
1.0000 mg | Freq: Once | INTRAMUSCULAR | Status: AC
  Administered 2015-10-04: 1 mg via INTRAVENOUS
  Filled 2015-10-04: qty 1

## 2015-10-04 MED ORDER — HYDROCODONE-ACETAMINOPHEN 5-325 MG PO TABS: 1.0000 | ORAL_TABLET | ORAL | 0 refills | Status: DC | PRN

## 2015-10-04 MED ORDER — ONDANSETRON HCL 4 MG/2ML IJ SOLN
4.0000 mg | Freq: Once | INTRAMUSCULAR | Status: AC
  Administered 2015-10-04: 4 mg via INTRAVENOUS
  Filled 2015-10-04: qty 2

## 2015-10-04 MED ORDER — IBUPROFEN 600 MG PO TABS: 600.0000 mg | ORAL_TABLET | Freq: Four times a day (QID) | ORAL | 0 refills | Status: DC | PRN

## 2015-10-04 MED ORDER — SODIUM CHLORIDE 0.9 % IV SOLN
Freq: Once | INTRAVENOUS | Status: AC
  Administered 2015-10-04: 06:00:00 via INTRAVENOUS

## 2015-10-04 NOTE — ED Triage Notes (Signed)
Per pt she developed left sided flank pain Saturday morning.  The pain was intermittent.  Pain began to increase in frequency and intensity.  +N/V.  Pt rates pain 10/10.

## 2015-10-04 NOTE — ED Notes (Signed)
No respiratory or acute distress noted alert and oriented x 3 call light in reach no reaction to medication noted. 

## 2015-10-04 NOTE — ED Notes (Signed)
Ultrasound at bedside for pelvic exam

## 2015-10-04 NOTE — ED Provider Notes (Signed)
[facility] DEPT Provider Note   CSN: [id number] Arrival date & time: 10/04/15  0445     History   Chief Complaint Chief Complaint  Patient presents with  . Flank Pain    HPI [place] is a 50 y.o. female.  Patient presents to the emergency department for evaluation of left flank pain. Patient reports that pain began yesterday morning and was present for several hours and then resolved. Overnight, however, she started to have sharp pains in her lower back radiating around to the left lower abdomen. This was accompanied by nausea, vomiting and chills. She has not documented a fever. She has not had urinary frequency, dysuria or hematuria. She does report a history of kidney stones many years ago, cannot recall if this feels similar.      Past Medical History:  Diagnosis Date  . Hypertension     There are no active problems to display for this patient.   Past Surgical History:  Procedure Laterality Date  . btl      OB History    No data available       Home Medications    Prior to Admission medications   Medication Sig Start Date End Date Taking? Authorizing Provider  EPINEPHrine (EPIPEN 2-PAK) 0.3 mg/0.3 mL IJ SOAJ injection  01/01/13  Yes Historical Provider, MD  Multiple Vitamins-Minerals (HAIR/SKIN/NAILS/BIOTIN PO) Take 1 tablet by mouth daily.   Yes Historical Provider, MD  valsartan-hydrochlorothiazide (DIOVAN-HCT) 160-25 MG tablet Take 1 tablet by mouth daily.   Yes Historical Provider, MD    Family History No family history on file.  Social History Social History  Substance Use Topics  . Smoking status: Never Smoker  . Smokeless tobacco: Never Used  . Alcohol use No     Allergies   Codeine   Review of Systems Review of Systems  Gastrointestinal: Positive for nausea and vomiting.  Genitourinary: Positive for flank pain.  All other systems reviewed and are negative.    Physical Exam Updated Vital Signs BP 128/65   Pulse 65    Temp 98.6 F (37 C) (Oral)   Resp 18   Ht 5\' 10"  (1.778 m)   Wt 240 lb (108.9 kg)   SpO2 100%   BMI 34.44 kg/m   Physical Exam  Constitutional: She is oriented to person, place, and time. She appears well-developed and well-nourished. No distress.  HENT:  Head: Normocephalic and atraumatic.  Right Ear: Hearing normal.  Left Ear: Hearing normal.  Nose: Nose normal.  Mouth/Throat: Oropharynx is clear and moist and mucous membranes are normal.  Eyes: Conjunctivae and EOM are normal. Pupils are equal, round, and reactive to light.  Neck: Normal range of motion. Neck supple.  Cardiovascular: Regular rhythm, S1 normal and S2 normal.  Exam reveals no gallop and no friction rub.   No murmur heard. Pulmonary/Chest: Effort normal and breath sounds normal. No respiratory distress. She exhibits no tenderness.  Abdominal: Soft. Normal appearance and bowel sounds are normal. There is no hepatosplenomegaly. There is tenderness in the left lower quadrant. There is CVA tenderness (left). There is no rebound, no guarding, no tenderness at McBurney's point and negative Murphy's sign. No hernia.  Genitourinary: Cervix exhibits discharge (white). Cervix exhibits no motion tenderness and no friability. Right adnexum displays no mass. Left adnexum displays no mass.  Musculoskeletal: Normal range of motion.  Neurological: She is alert and oriented to person, place, and time. She has normal strength. No cranial nerve deficit or sensory deficit. Coordination normal.  GCS eye subscore is 4. GCS verbal subscore is 5. GCS motor subscore is 6.  Skin: Skin is warm, dry and intact. No rash noted. No cyanosis.  Psychiatric: She has a normal mood and affect. Her speech is normal and behavior is normal. Thought content normal.  Nursing note and vitals reviewed.    ED Treatments / Results  Labs (all labs ordered are listed, but only abnormal results are displayed) Labs Reviewed  URINALYSIS, ROUTINE W REFLEX  MICROSCOPIC (NOT AT [facility]) - Abnormal; Notable for the following:       Result Value   APPearance CLOUDY (*)    Hgb urine dipstick MODERATE (*)    Protein, ur 30 (*)    All other components within normal limits  CBC WITH DIFFERENTIAL/PLATELET - Abnormal; Notable for the following:    Lymphs Abs 0.6 (*)    All other components within normal limits  BASIC METABOLIC PANEL - Abnormal; Notable for the following:    Potassium 3.1 (*)    Chloride 99 (*)    Glucose, Bld 131 (*)    All other components within normal limits  URINE MICROSCOPIC-ADD ON - Abnormal; Notable for the following:    Squamous Epithelial / LPF 0-5 (*)    Bacteria, UA FEW (*)    All other components within normal limits    EKG  EKG Interpretation None       Radiology Ct Renal Stone Study  Result Date: 10/04/2015 CLINICAL DATA:  Initial evaluation for acute left flank pain. EXAM: CT ABDOMEN AND PELVIS WITHOUT CONTRAST TECHNIQUE: Multidetector CT imaging of the abdomen and pelvis was performed following the standard protocol without IV contrast. COMPARISON:  Prior CT from 06/26/2008. FINDINGS: Lower chest: Visualized lung bases are clear. Hepatobiliary: Limited noncontrast evaluation of the liver is unremarkable. Gallbladder within normal limits. No biliary dilatation. Pancreas: Pancreas within normal limits. Spleen: Spleen within normal limits. Adrenals/Urinary Tract: Adrenal glands unremarkable. Kidneys equal in size without evidence of nephrolithiasis or hydronephrosis. No radiopaque calculi seen along the course of either renal collecting system. Bladder decompressed without definite abnormality. No layering stones present within the bladder lumen. Stomach/Bowel: Probable small hiatal hernia noted. Stomach otherwise unremarkable. No evidence for bowel obstruction. Appendix within normal limits. No abnormal wall thickening or inflammatory fat stranding seen about the bowels. Vascular/Lymphatic: No pathologically enlarged  intra-abdominal or pelvic lymph nodes identified. Reproductive: Patient is status post tubal ligation. Uterus grossly unremarkable. Ovaries within normal limits. 4.4 x 5.2 x 3.2 cm complex hypodense lesion within the left adnexa, indeterminate, possibly hydrosalpinx versus multiloculated cystic lesion. Other: No free air or fluid. Musculoskeletal: No acute osseous abnormality. No worrisome lytic or blastic osseous lesions. Moderate degenerative spondylolysis noted at L5-S1. IMPRESSION: 1. No CT evidence for nephrolithiasis or obstructive uropathy. 2. 4.4 x 5.2 x 3.2 cm complex hypodense lesion within the left adnexa. Finding is indeterminate, and may in fact reflect a hydrosalpinx, but is incompletely evaluated on this exam. Further evaluation dedicated pelvic ultrasound recommended. 3. No other acute intra-abdominal or pelvic process. Electronically Signed   By: Jeannine Boga M.D.   On: 10/04/2015 06:11    Procedures Procedures (including critical care time)  Medications Ordered in ED Medications  0.9 %  sodium chloride infusion ( Intravenous Stopped 10/04/15 0622)  ondansetron (ZOFRAN) injection 4 mg (4 mg Intravenous Given 10/04/15 0543)  HYDROmorphone (DILAUDID) injection 1 mg (1 mg Intravenous Given 10/04/15 0543)     Initial Impression / Assessment and Plan / ED Course  I  have reviewed the triage vital signs and the nursing notes.  Pertinent labs & imaging results that were available during my care of the patient were reviewed by me and considered in my medical decision making (see chart for details).  Clinical Course   Patient presented to the ER for evaluation of left flank pain. Patient reports the pain began early yesterday and then resolved, came back overnight. Patient complaining of severe pain in the left lower abdomen and pelvic area as well as in the low back. Any stony kidney infection was considered. Urinalysis did show blood in her urine but no signs of infection. CT scan  did not show evidence of hydronephrosis, hydroureter or ureterolithiasis. There is evidence of cystic structure in the left adnexa. Ultrasound recommended. Patient feeling better after analgesia. Pelvic exam did not reveal any masses in the left pelvic region, white cervical discharge but no cervical motion tenderness. Will obtain ultrasound, sign out to oncoming ER physician to follow-up ultrasound and disposition patient.  Final Clinical Impressions(s) / ED Diagnoses   Final diagnoses:  Pelvic pain in female  Flank pain  Cyst of left ovary    New Prescriptions New Prescriptions   No medications on file     Orpah Greek, MD 10/04/15 [phone]

## 2015-10-04 NOTE — ED Provider Notes (Signed)
Results of the [place] reviewed with patient.  Pt also told that she has bacterial vaginosis and that she will be treated for that.  She knows to f/u with her Gyn.  She knows to return if worse.   Debra Pence, MD 10/04/15 [phone]

## 2015-10-04 NOTE — ED Notes (Signed)
Pt ambulated to restroom prior to ultrasound.

## 2015-10-20 ENCOUNTER — Ambulatory Visit: Payer: Self-pay | Admitting: Obstetrics

## 2016-07-22 ENCOUNTER — Other Ambulatory Visit: Payer: Self-pay | Admitting: Occupational Medicine

## 2016-07-22 ENCOUNTER — Ambulatory Visit: Payer: Self-pay

## 2016-07-22 DIAGNOSIS — Z0000 Encounter for general adult medical examination without abnormal findings: Secondary | ICD-10-CM

## 2016-07-22 IMAGING — CR DG CHEST 1V
1 series · 1 of 1 positions shown · non-contrast
Comparison: Limited correlation made with abdominal CT [DATE].

CLINICAL DATA: Physical examination. Nonsmoker. History of
hypertension.

EXAM:
CHEST 1 VIEW

[view not recorded]
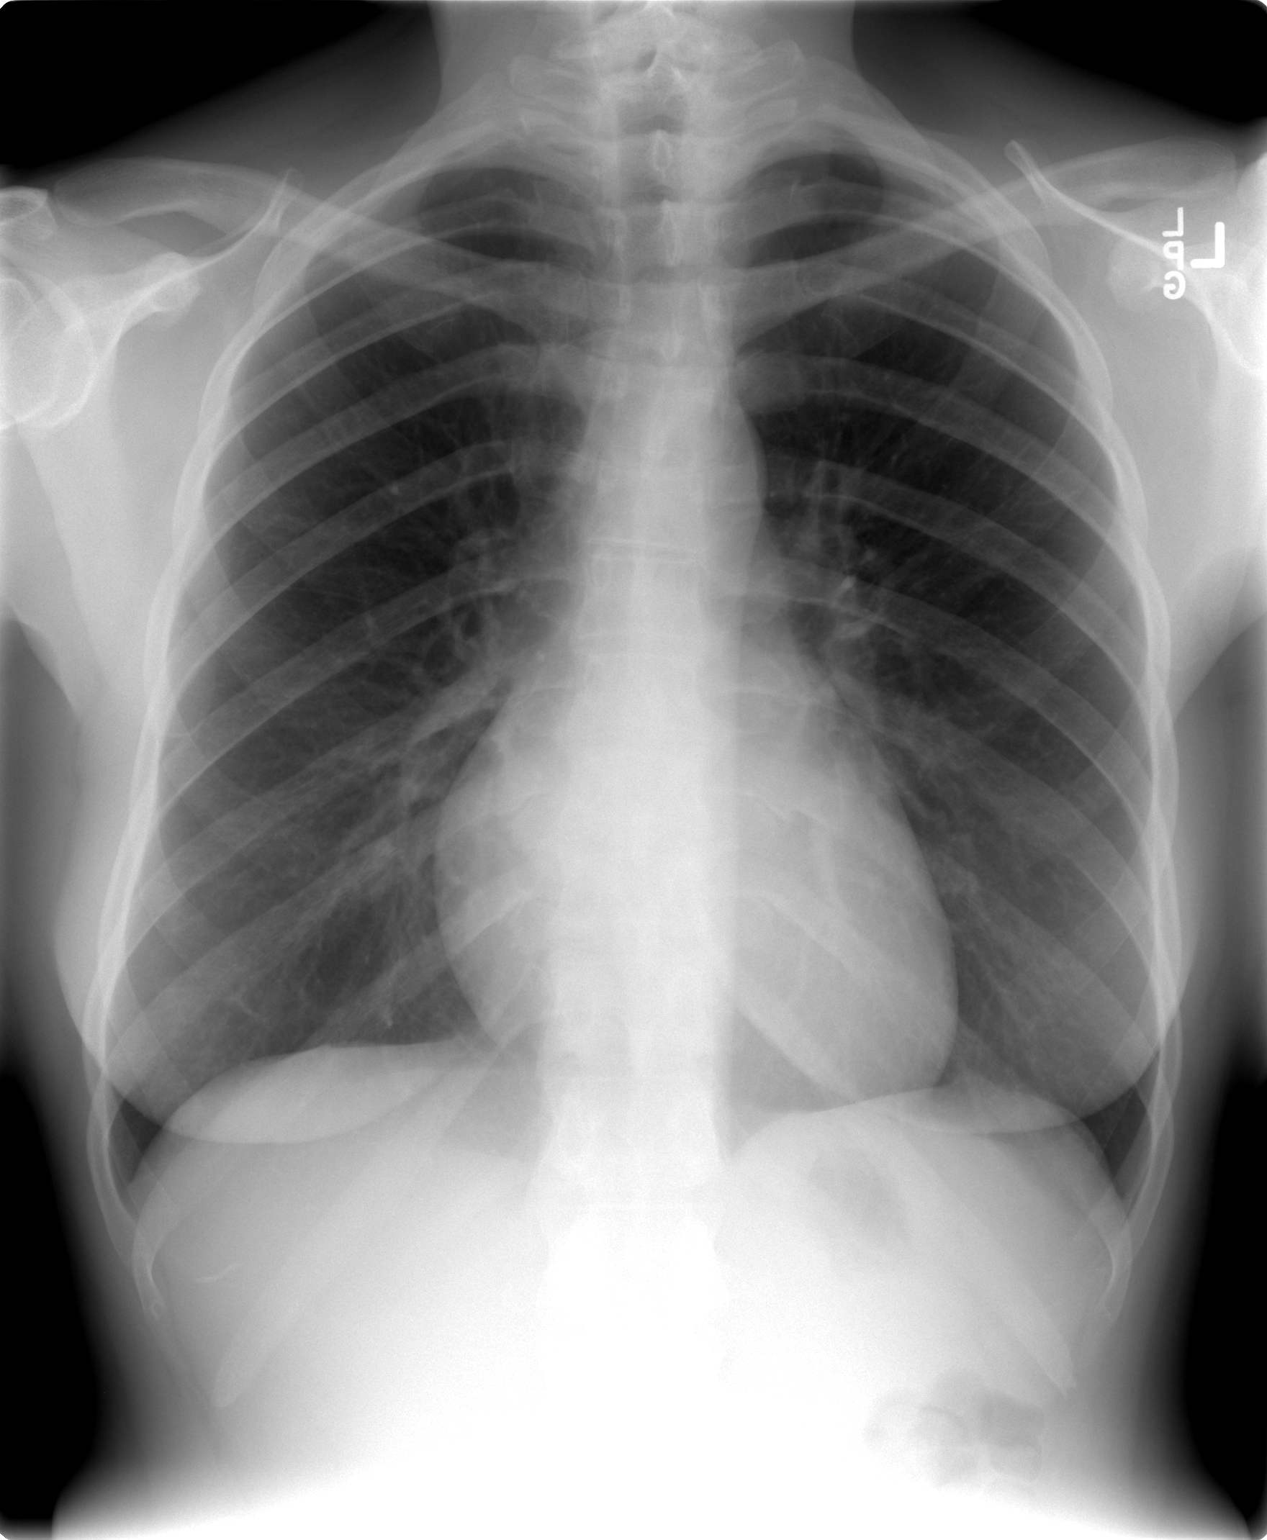

[1 of 1 positions shown; findings below may reference images not displayed]

FINDINGS: The heart size and mediastinal contours are normal. The lungs are
clear. There is no pleural effusion or pneumothorax. No acute
osseous findings are identified.
IMPRESSION: No active cardiopulmonary process.

## 2017-02-28 DIAGNOSIS — M21621 Bunionette of right foot: Secondary | ICD-10-CM | POA: Diagnosis not present

## 2017-02-28 DIAGNOSIS — L639 Alopecia areata, unspecified: Secondary | ICD-10-CM | POA: Diagnosis not present

## 2017-02-28 DIAGNOSIS — D179 Benign lipomatous neoplasm, unspecified: Secondary | ICD-10-CM | POA: Diagnosis not present

## 2017-02-28 DIAGNOSIS — I10 Essential (primary) hypertension: Secondary | ICD-10-CM | POA: Diagnosis not present

## 2017-02-28 DIAGNOSIS — G5690 Unspecified mononeuropathy of unspecified upper limb: Secondary | ICD-10-CM | POA: Diagnosis not present

## 2017-02-28 DIAGNOSIS — M7751 Other enthesopathy of right foot: Secondary | ICD-10-CM | POA: Diagnosis not present

## 2017-02-28 DIAGNOSIS — T7840XA Allergy, unspecified, initial encounter: Secondary | ICD-10-CM | POA: Diagnosis not present

## 2017-03-10 DIAGNOSIS — D179 Benign lipomatous neoplasm, unspecified: Secondary | ICD-10-CM | POA: Diagnosis not present

## 2017-03-20 DIAGNOSIS — R200 Anesthesia of skin: Secondary | ICD-10-CM | POA: Diagnosis not present

## 2017-04-06 DIAGNOSIS — D179 Benign lipomatous neoplasm, unspecified: Secondary | ICD-10-CM | POA: Diagnosis not present

## 2017-04-10 DIAGNOSIS — Z01812 Encounter for preprocedural laboratory examination: Secondary | ICD-10-CM | POA: Diagnosis not present

## 2017-04-17 DIAGNOSIS — Z1231 Encounter for screening mammogram for malignant neoplasm of breast: Secondary | ICD-10-CM | POA: Diagnosis not present

## 2017-04-26 DIAGNOSIS — B070 Plantar wart: Secondary | ICD-10-CM | POA: Diagnosis not present

## 2017-04-26 DIAGNOSIS — D179 Benign lipomatous neoplasm, unspecified: Secondary | ICD-10-CM | POA: Diagnosis not present

## 2017-05-03 DIAGNOSIS — B070 Plantar wart: Secondary | ICD-10-CM | POA: Diagnosis not present

## 2017-12-07 ENCOUNTER — Other Ambulatory Visit: Payer: Self-pay | Admitting: Family Medicine

## 2017-12-07 ENCOUNTER — Other Ambulatory Visit (HOSPITAL_COMMUNITY)
Admission: RE | Admit: 2017-12-07 | Discharge: 2017-12-07 | Disposition: A | Discharge status: Home / Self Care | Payer: Self-pay | Source: Ambulatory Visit | Attending: Family Medicine | Admitting: Family Medicine

## 2017-12-07 DIAGNOSIS — G5690 Unspecified mononeuropathy of unspecified upper limb: Secondary | ICD-10-CM | POA: Diagnosis not present

## 2017-12-07 DIAGNOSIS — R635 Abnormal weight gain: Secondary | ICD-10-CM | POA: Diagnosis not present

## 2017-12-07 DIAGNOSIS — I10 Essential (primary) hypertension: Secondary | ICD-10-CM | POA: Diagnosis not present

## 2017-12-07 DIAGNOSIS — N898 Other specified noninflammatory disorders of vagina: Secondary | ICD-10-CM | POA: Insufficient documentation

## 2017-12-07 DIAGNOSIS — L639 Alopecia areata, unspecified: Secondary | ICD-10-CM | POA: Diagnosis not present

## 2017-12-11 LAB — URINE CYTOLOGY ANCILLARY ONLY
BACTERIAL VAGINITIS: POSITIVE — AB
Candida vaginitis: NEGATIVE
Chlamydia: NEGATIVE
Neisseria Gonorrhea: NEGATIVE
TRICH (WINDOWPATH): NEGATIVE

## 2018-03-27 ENCOUNTER — Other Ambulatory Visit: Payer: Self-pay | Admitting: Orthopaedic Surgery

## 2018-03-27 DIAGNOSIS — M5412 Radiculopathy, cervical region: Secondary | ICD-10-CM

## 2018-03-29 ENCOUNTER — Ambulatory Visit
Admission: RE | Admit: 2018-03-29 | Discharge: 2018-03-29 | Disposition: A | Discharge status: Home / Self Care | Payer: 59 | Source: Ambulatory Visit | Attending: Orthopaedic Surgery | Admitting: Orthopaedic Surgery

## 2018-03-29 DIAGNOSIS — M5412 Radiculopathy, cervical region: Secondary | ICD-10-CM

## 2018-03-29 IMAGING — MR MR CERVICAL SPINE W/O CM
5 series · 41 of 48 positions shown · non-contrast
Comparison: Cervical spine radiographs [DATE]

CLINICAL DATA: Chronic neck and bilateral shoulder pain and arm
pain with numbness in hands for 2 weeks.

EXAM:
MRI CERVICAL SPINE WITHOUT CONTRAST
TECHNIQUE: Multiplanar, multisequence MR imaging of the cervical spine was
performed. No intravenous contrast was administered.

[Series 5: T1 · sagittal · 3.0mm · 0.82mm/px · 6 of 15 slices shown]
[im 1/15]
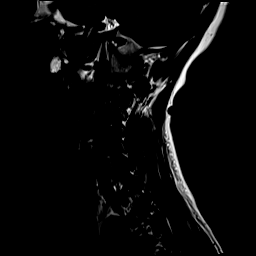
[im 3/15]
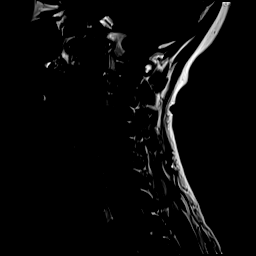
[im 6/15]
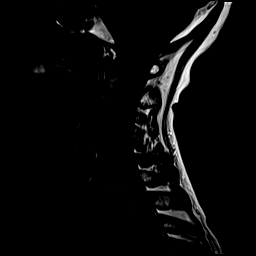
[im 9/15]
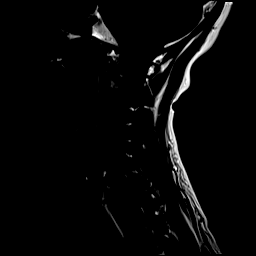
[im 12/15]
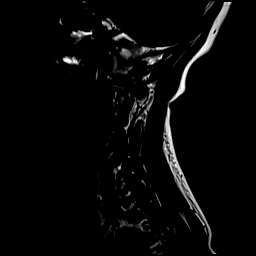
[im 15/15]
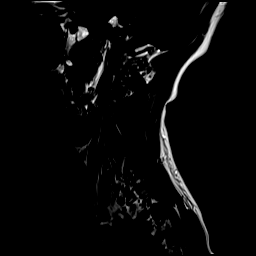

[Series 6: T2 · sagittal · 3.0mm · 0.66mm/px · 7 of 15 slices shown (1 of 2)]
[im 1/15]
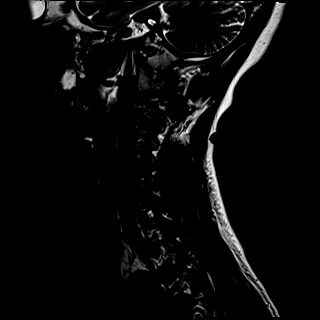
[im 3/15]
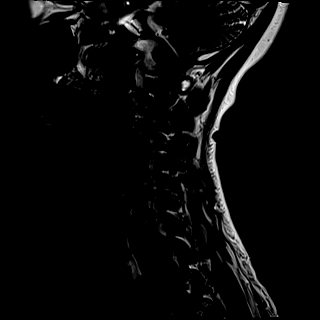
[im 5/15]
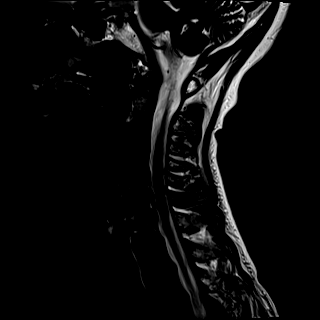
[im 8/15]
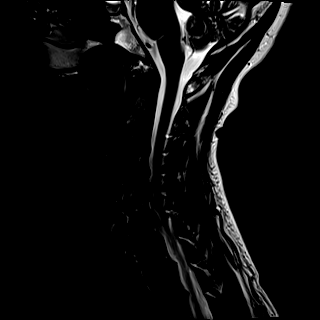
[im 10/15]
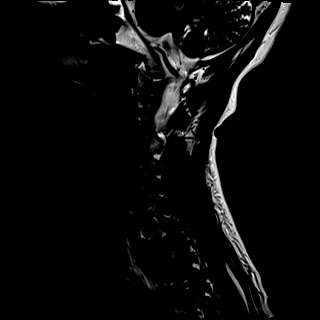
[im 12/15]
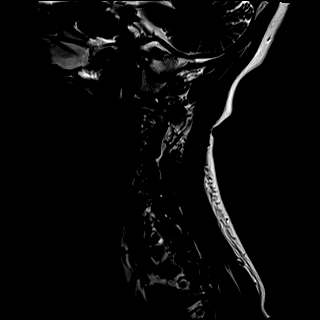
[im 15/15]
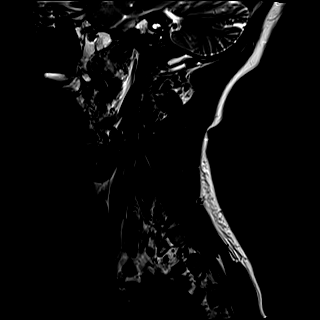

[Series 7: STIR · sagittal · 3.0mm · 0.41mm/px · 7 of 15 slices shown]
[im 1/15]
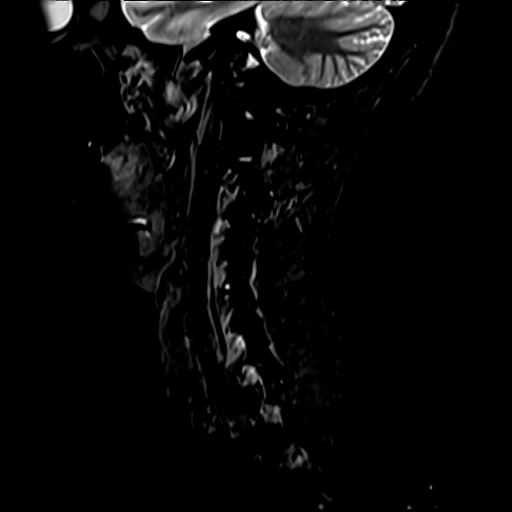
[im 3/15]
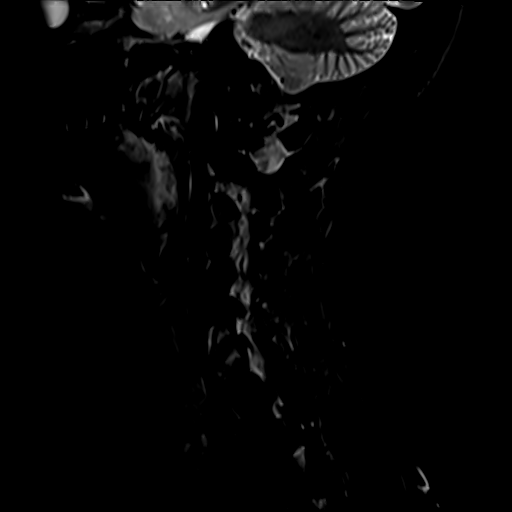
[im 5/15]
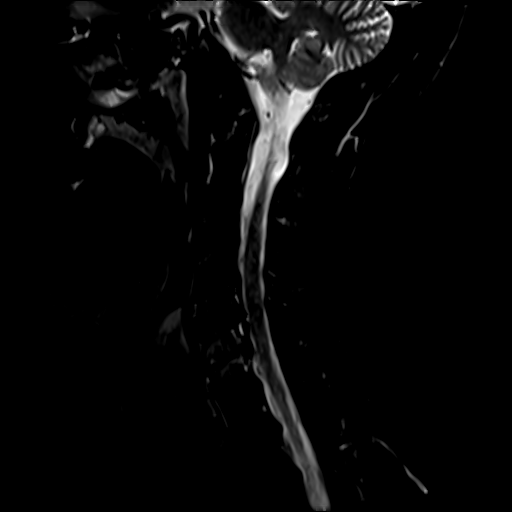
[im 8/15]
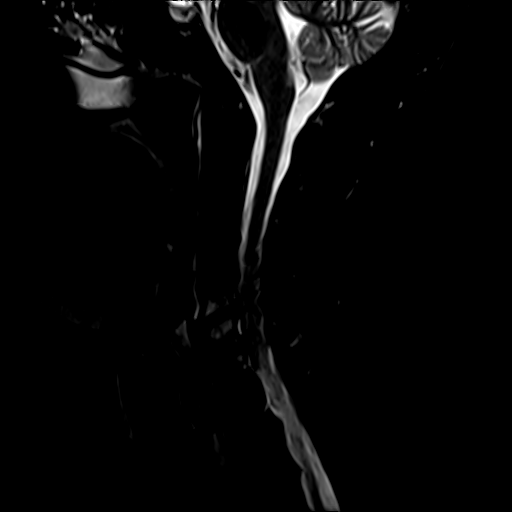
[im 10/15]
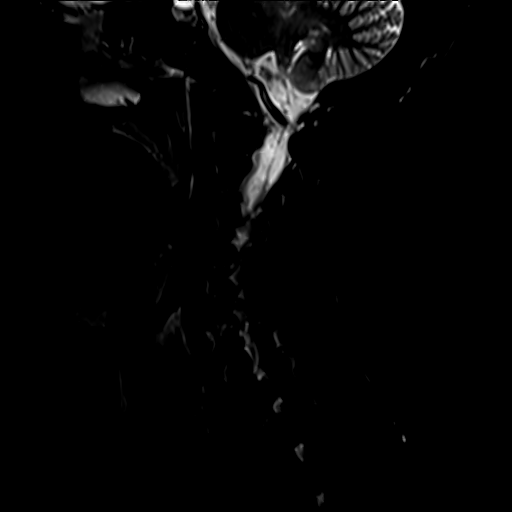
[im 12/15]
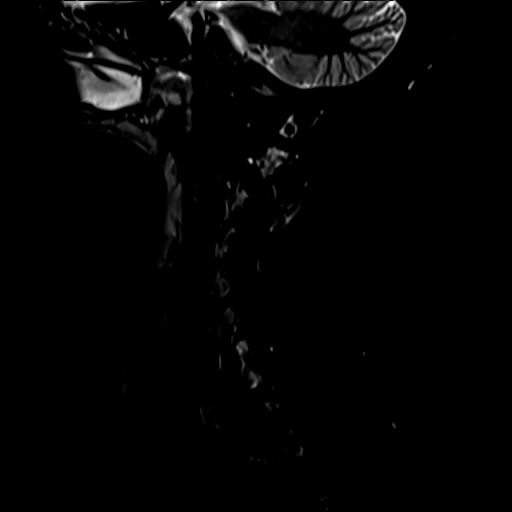
[im 15/15]
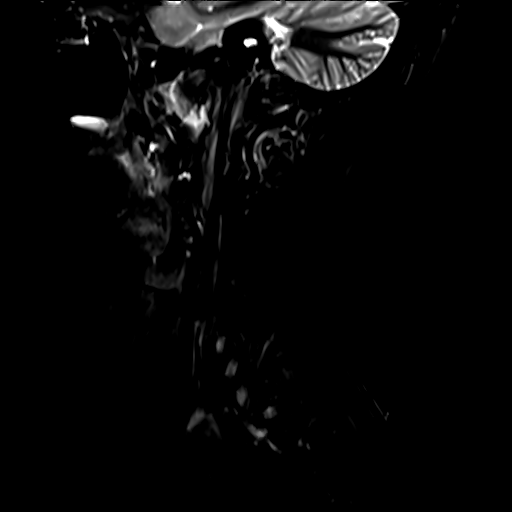

[Series 8: T2 · axial · 3.0mm · 0.50mm/px · z∈[-60,+51]mm · 13 of 32 slices shown (2 of 2)]
[im 1/32]
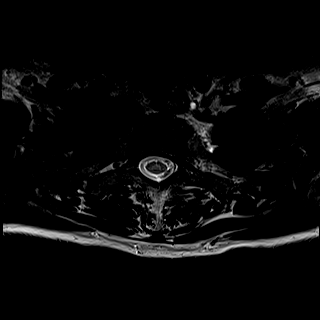
[im 3/32]
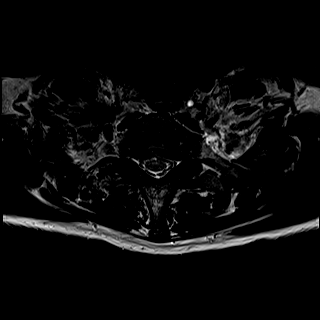
[im 5/32]
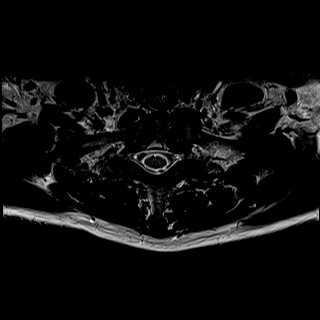
[im 8/32]
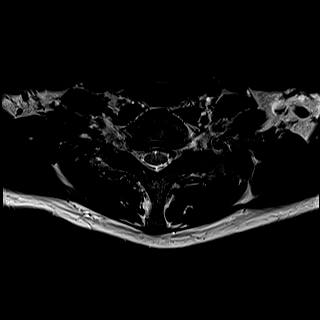
[im 10/32]
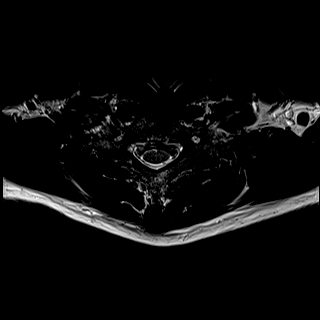
[im 12/32]
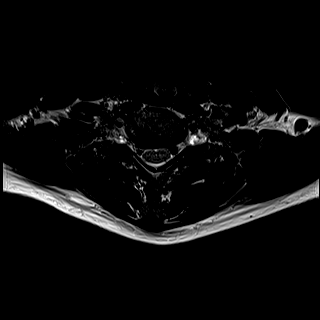
[im 15/32]
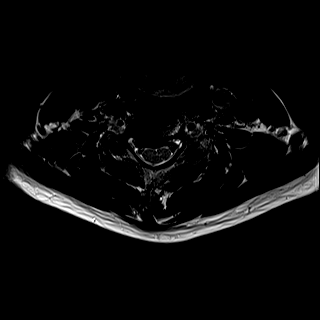
[im 17/32]
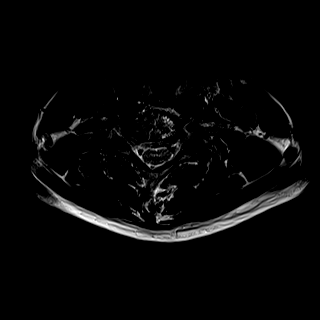
[im 20/32]
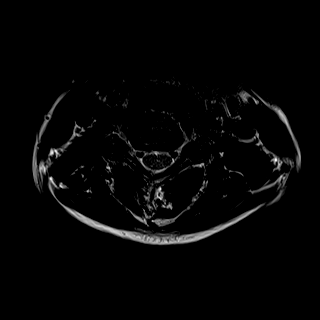
[im 22/32]
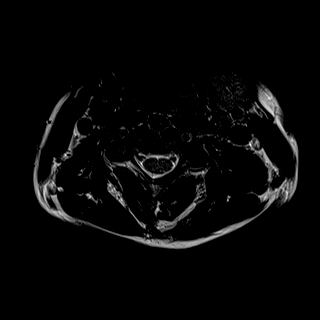
[im 24/32]
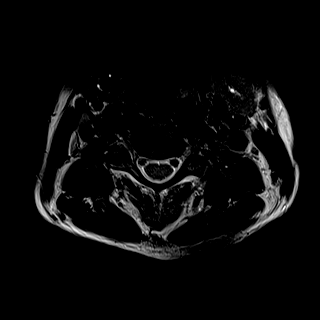
[im 27/32]
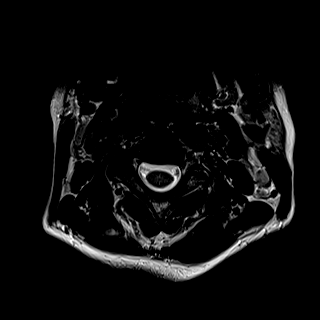
[im 32/32]
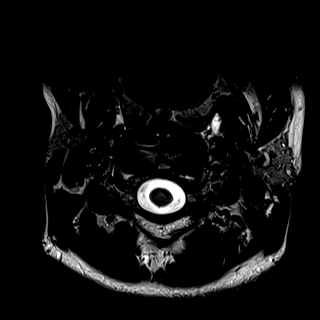

[Series 9: GRE · axial · 3.0mm · 0.83mm/px · z∈[-60,+51]mm · 8 of 32 slices shown]
[im 1/32]
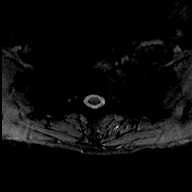
[im 5/32]
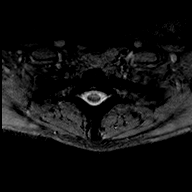
[im 10/32]
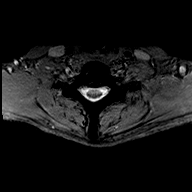
[im 15/32]
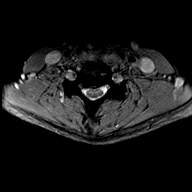
[im 17/32]
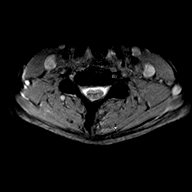
[im 22/32]
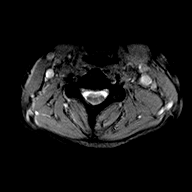
[im 27/32]
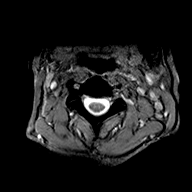
[im 32/32]
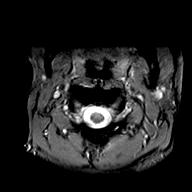

[41 of 48 positions shown; findings below may reference images not displayed]

FINDINGS: Alignment: Normal

Vertebrae: Normal marrow signal except for endplate reactive changes
at C5-6 and C6-7. No worrisome bone lesions or fractures.

Cord: Normal cord signal intensity.  No cord lesions or syrinx.

Posterior Fossa, vertebral arteries, paraspinal tissues: No
significant findings.

Disc levels:

C2-3: No significant findings.

C3-4: No significant findings.

C4-5: Mild bulging annulus but no focal disc protrusions, spinal or
significant foraminal stenosis. There is mild uncinate spurring
changes and mild foraminal narrowing bilaterally.

C5-6: Bulging degenerated annulus, broad-based disc protrusion,
osteophytic ridging and uncinate spurring. There is moderate mass
effect on the ventral thecal sac and near obliteration of the
ventral CSF space. There is moderately severe right foraminal
stenosis and mild left foraminal stenosis.

C6-7: Bulging degenerated annulus, osteophytic ridging and uncinate
spurring. There is mass effect on the ventral thecal sac and
narrowing the ventral CSF space. There is also mild to moderate
right foraminal stenosis. No left-sided foraminal stenosis.

C7-T1: No significant findings.
IMPRESSION: 1. Degenerative cervical spondylosis with disc disease and facet
disease most significant at C5-6 and C6-7.
2. Mild bilateral foraminal narrowing at C4-5.
3. Multifactorial spinal and bilateral foraminal stenosis at C5-6.
Moderately severe right foraminal stenosis and mild left foraminal
stenosis.
4. Mild spinal and mild to moderate right foraminal stenosis at
C6-7.

## 2018-04-16 ENCOUNTER — Other Ambulatory Visit: Payer: Self-pay | Admitting: Family Medicine

## 2018-04-16 ENCOUNTER — Other Ambulatory Visit: Payer: Self-pay

## 2018-04-16 ENCOUNTER — Ambulatory Visit: Payer: Self-pay

## 2018-04-16 DIAGNOSIS — M25521 Pain in right elbow: Secondary | ICD-10-CM

## 2018-04-16 IMAGING — DX RIGHT ELBOW - COMPLETE 3+ VIEW
4 series · 4 of 4 positions shown · non-contrast
Comparison: None.

CLINICAL DATA: Elbow pain for the past month.  No known injury.

EXAM:
RIGHT ELBOW - COMPLETE 3+ VIEW

[elbow ap]
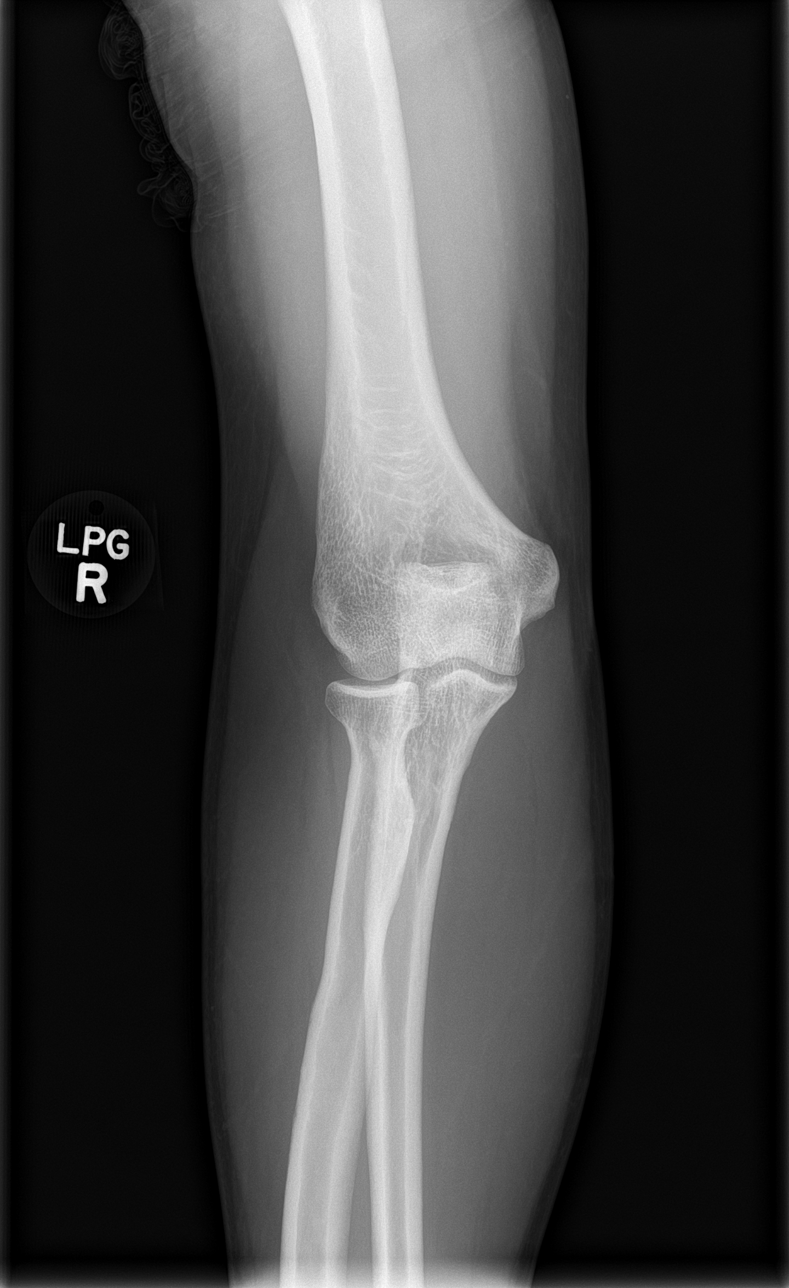

[elbow obl (1 of 2)]
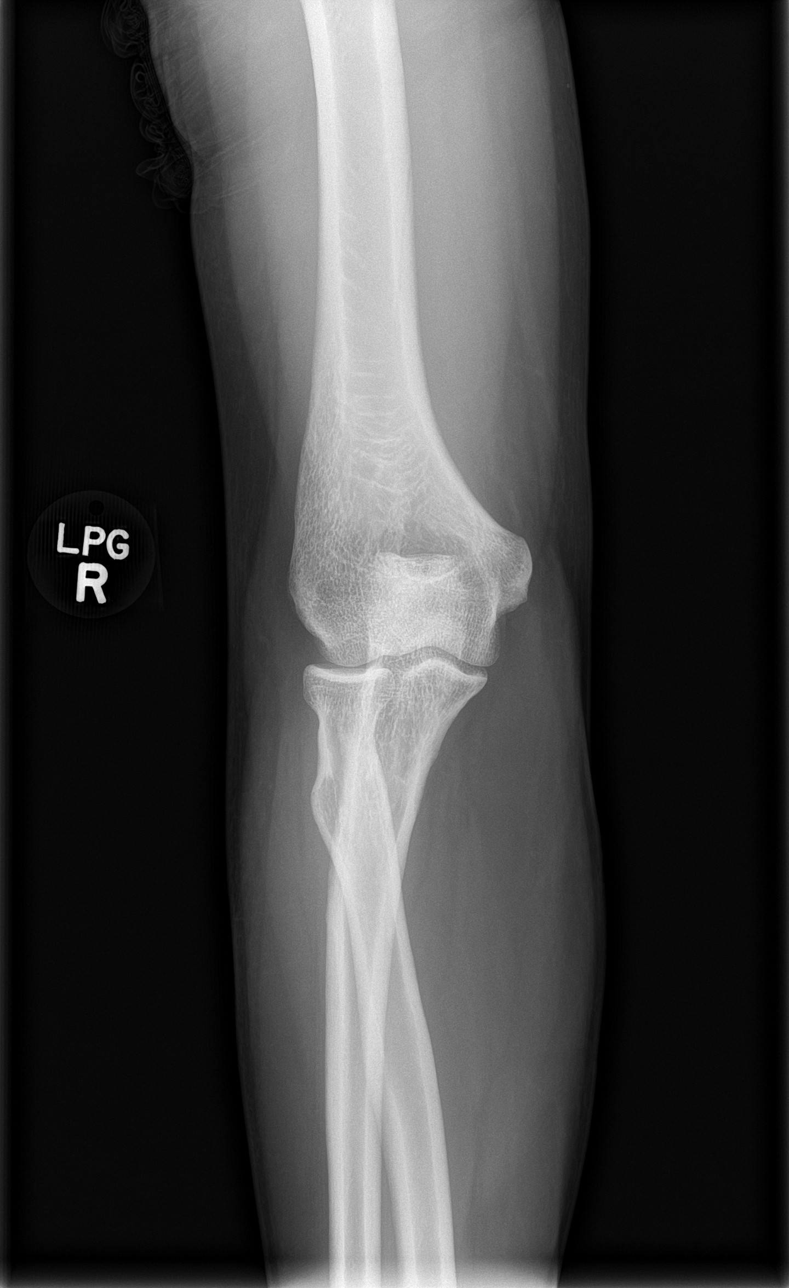

[elbow obl (2 of 2)]
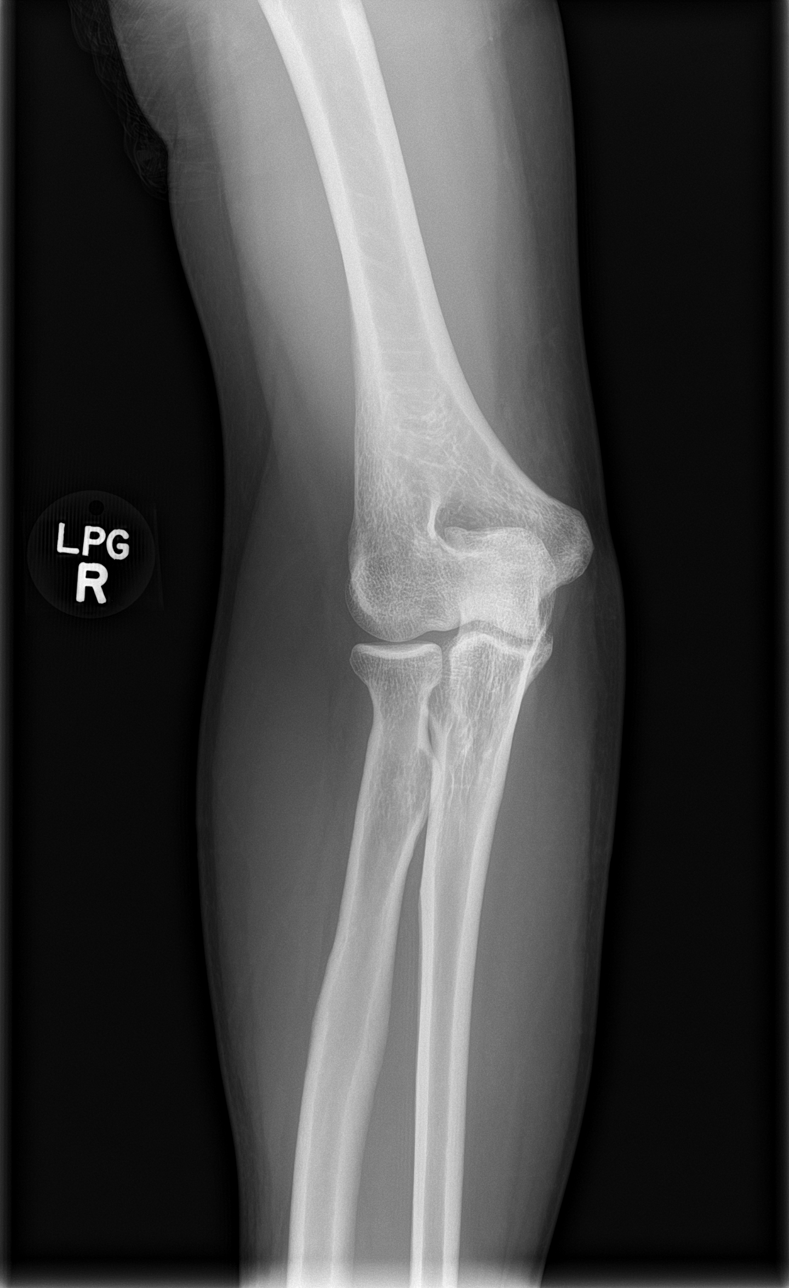

[elbow lat]
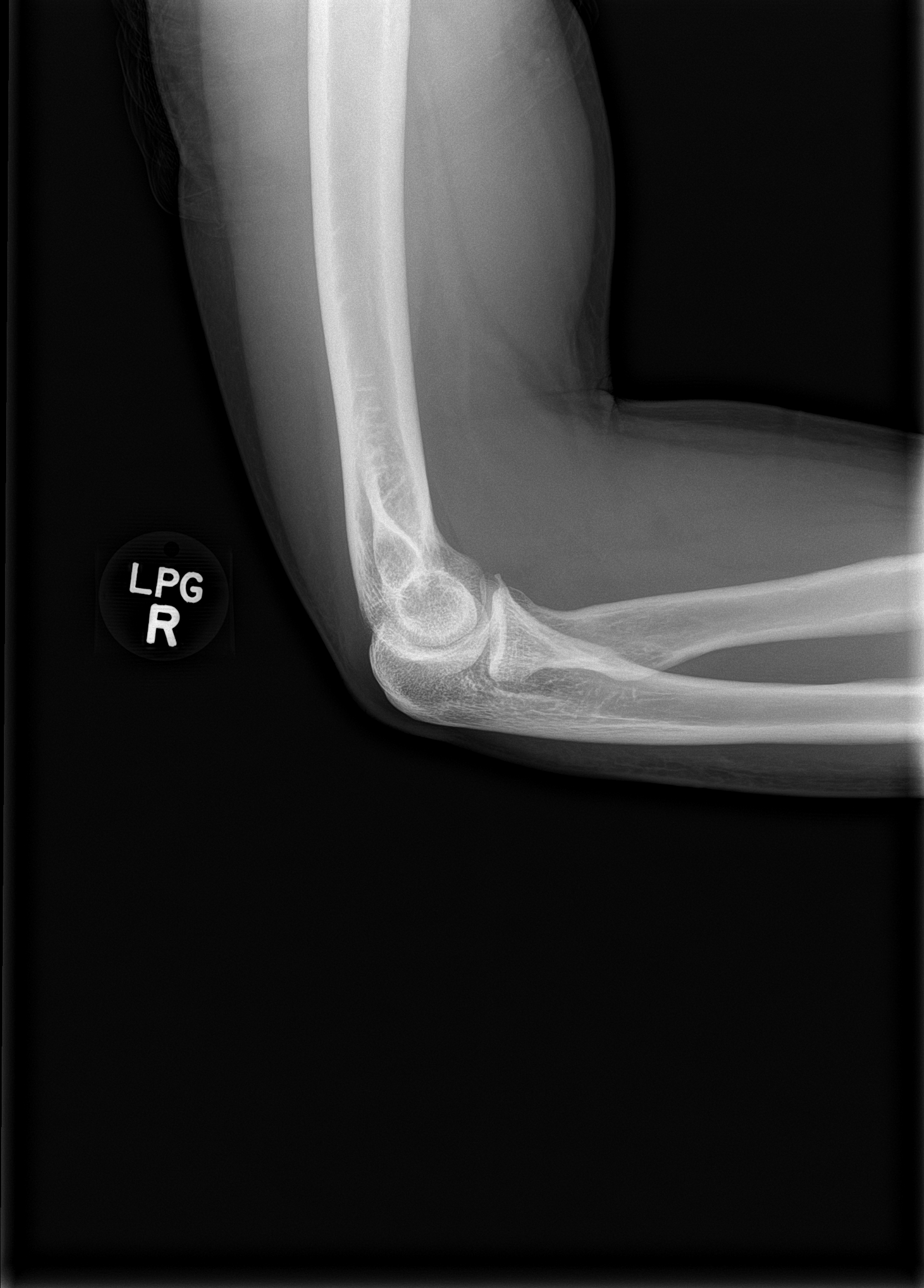

[4 of 4 positions shown; findings below may reference images not displayed]

FINDINGS: There is no evidence of fracture, dislocation, or joint effusion.
There is no evidence of arthropathy or other focal bone abnormality.
Soft tissues are unremarkable.
IMPRESSION: Negative.

## 2019-02-07 ENCOUNTER — Other Ambulatory Visit: Payer: Self-pay | Admitting: Family Medicine

## 2019-02-07 ENCOUNTER — Ambulatory Visit
Admission: RE | Admit: 2019-02-07 | Discharge: 2019-02-07 | Disposition: A | Discharge status: Home / Self Care | Payer: 59 | Source: Ambulatory Visit | Attending: Family Medicine | Admitting: Family Medicine

## 2019-02-07 ENCOUNTER — Other Ambulatory Visit: Payer: Self-pay

## 2019-02-07 DIAGNOSIS — R0789 Other chest pain: Secondary | ICD-10-CM

## 2019-02-07 DIAGNOSIS — I10 Essential (primary) hypertension: Secondary | ICD-10-CM | POA: Diagnosis not present

## 2019-02-07 IMAGING — CR DG CHEST 2V
3 series · 3 of 3 positions shown · non-contrast
Comparison: [DATE]

CLINICAL DATA: Right-sided pleuritic chest pain.

EXAM:
CHEST - 2 VIEW

[w chest pa]
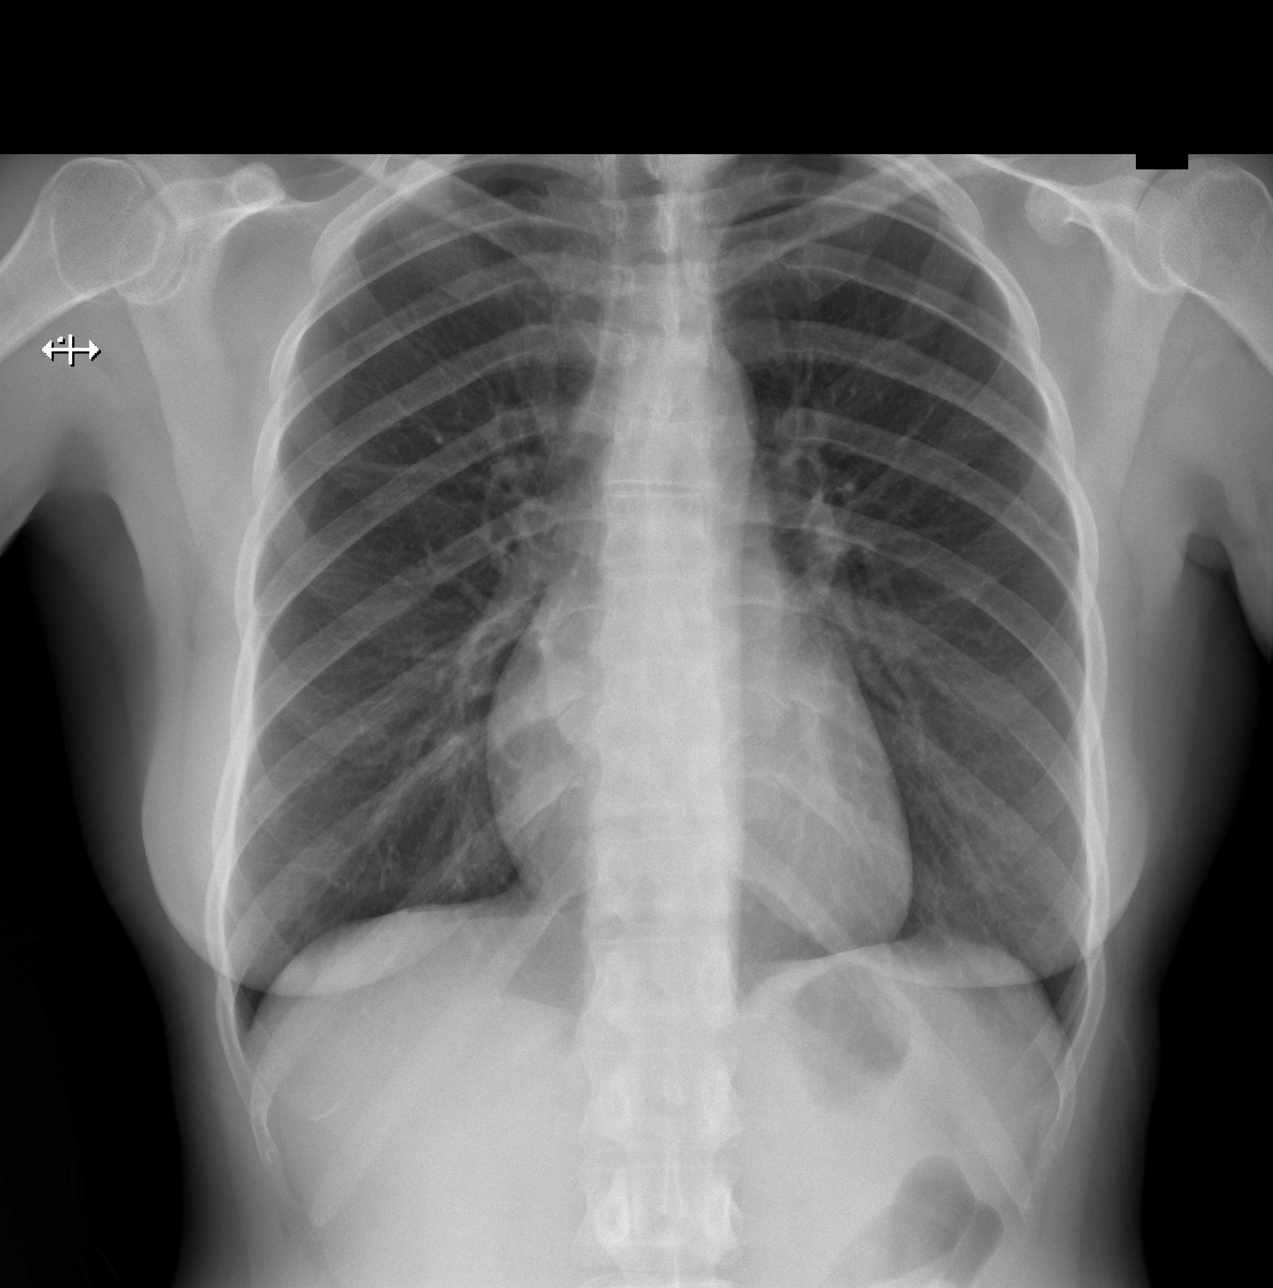

[w chest lat]
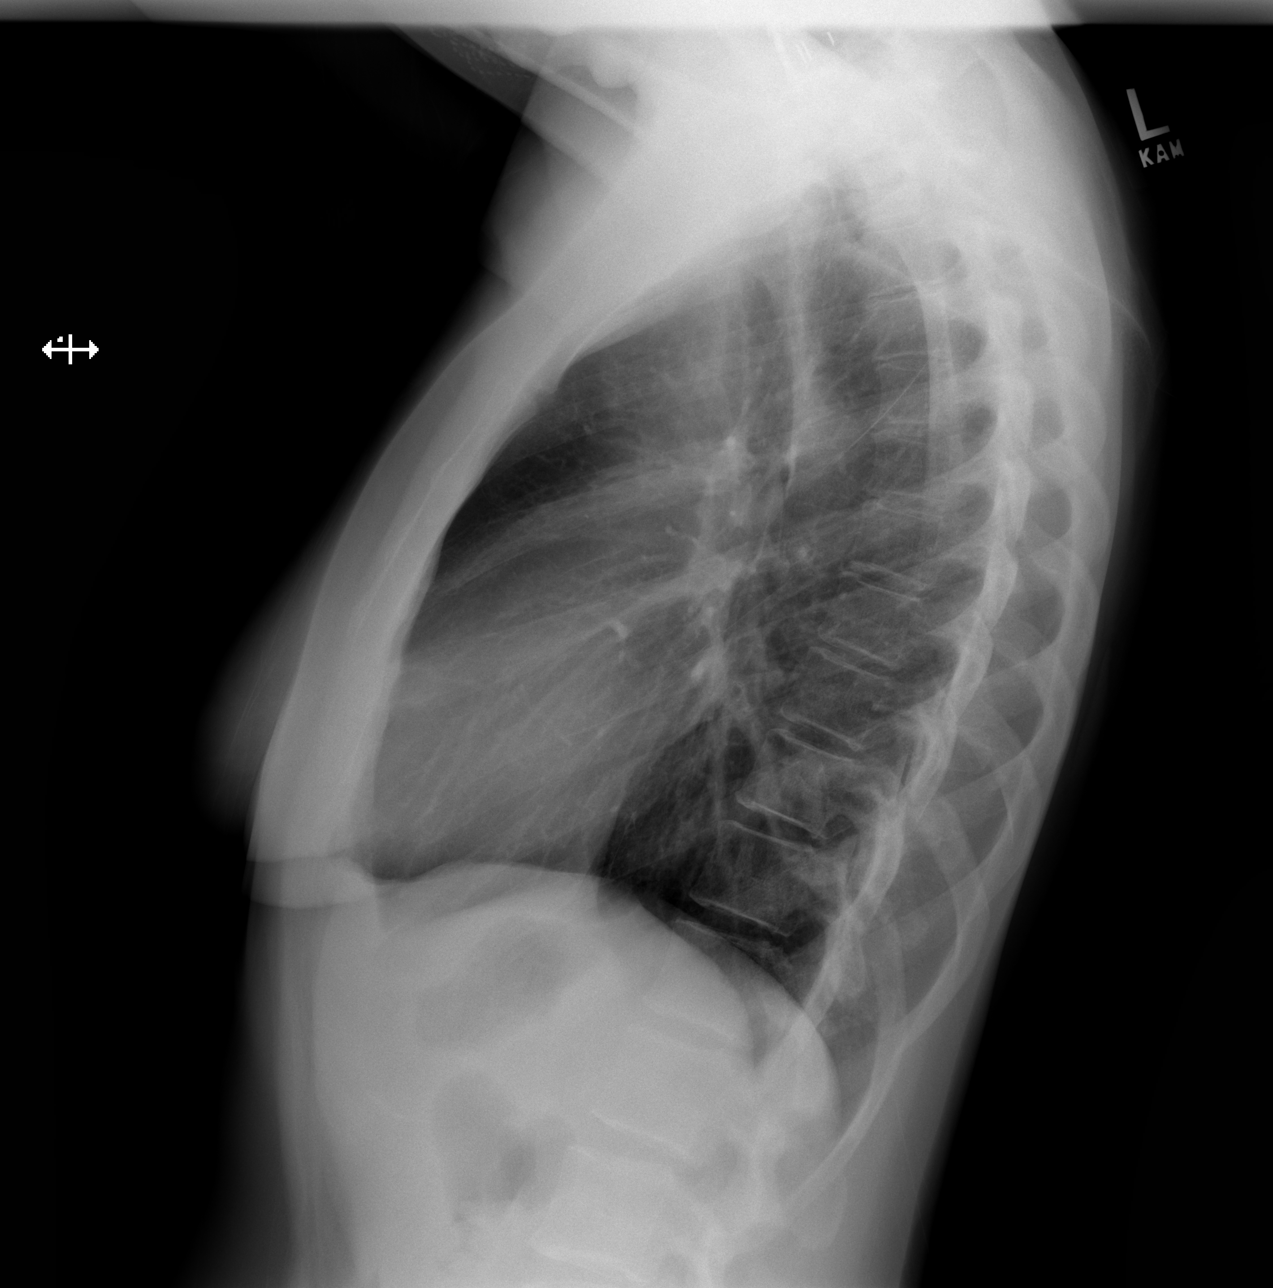

[w chest decub]
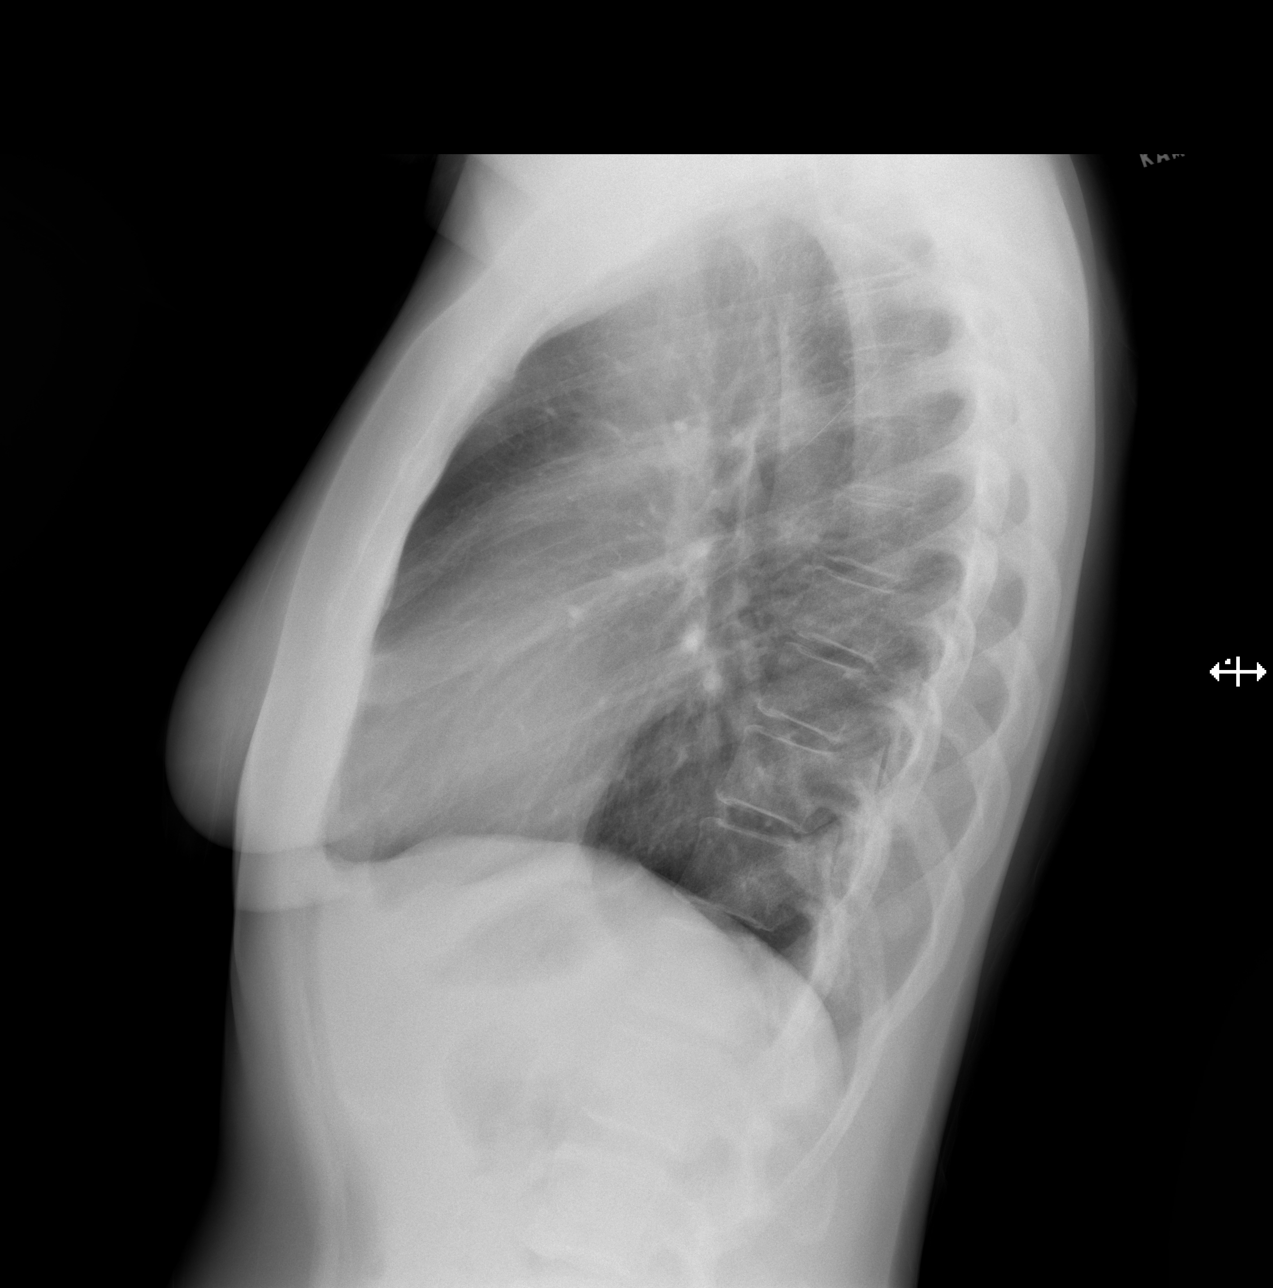

[3 of 3 positions shown; findings below may reference images not displayed]

FINDINGS: The heart size and mediastinal contours are within normal limits.
Both lungs are clear. No evidence of pneumothorax or pleural
effusion. Cervical spine fusion hardware noted.
IMPRESSION: No active cardiopulmonary disease.

## 2020-05-11 DIAGNOSIS — M5412 Radiculopathy, cervical region: Secondary | ICD-10-CM | POA: Diagnosis not present

## 2020-05-11 DIAGNOSIS — G5600 Carpal tunnel syndrome, unspecified upper limb: Secondary | ICD-10-CM | POA: Diagnosis not present

## 2020-05-11 DIAGNOSIS — M654 Radial styloid tenosynovitis [de Quervain]: Secondary | ICD-10-CM | POA: Diagnosis not present

## 2020-05-11 DIAGNOSIS — M4322 Fusion of spine, cervical region: Secondary | ICD-10-CM | POA: Diagnosis not present

## 2020-05-11 DIAGNOSIS — M542 Cervicalgia: Secondary | ICD-10-CM | POA: Diagnosis not present

## 2020-05-15 ENCOUNTER — Other Ambulatory Visit: Payer: Self-pay | Admitting: Rehabilitation

## 2020-05-15 DIAGNOSIS — M4322 Fusion of spine, cervical region: Secondary | ICD-10-CM

## 2020-05-25 DIAGNOSIS — Z1231 Encounter for screening mammogram for malignant neoplasm of breast: Secondary | ICD-10-CM | POA: Diagnosis not present

## 2020-05-30 ENCOUNTER — Other Ambulatory Visit: Payer: Self-pay

## 2020-06-06 ENCOUNTER — Other Ambulatory Visit: Payer: Self-pay

## 2020-06-11 ENCOUNTER — Ambulatory Visit
Admission: RE | Admit: 2020-06-11 | Discharge: 2020-06-11 | Disposition: A | Discharge status: Home / Self Care | Payer: BC Managed Care – PPO | Source: Ambulatory Visit | Attending: Rehabilitation | Admitting: Rehabilitation

## 2020-06-11 ENCOUNTER — Other Ambulatory Visit: Payer: Self-pay

## 2020-06-11 DIAGNOSIS — M4802 Spinal stenosis, cervical region: Secondary | ICD-10-CM | POA: Diagnosis not present

## 2020-06-11 DIAGNOSIS — M4322 Fusion of spine, cervical region: Secondary | ICD-10-CM

## 2020-06-11 DIAGNOSIS — R200 Anesthesia of skin: Secondary | ICD-10-CM | POA: Diagnosis not present

## 2020-06-11 DIAGNOSIS — M778 Other enthesopathies, not elsewhere classified: Secondary | ICD-10-CM | POA: Diagnosis not present

## 2020-06-11 IMAGING — MR MR CERVICAL SPINE WO/W CM
7 of 9 series · 32 of 48 positions shown · IV contrast (multihance)
Comparison: MRI cervical spine [DATE]

CLINICAL DATA: Cervical fusion. Numbness in the right upper
extremity. Bilateral wrist pain.

EXAM:
MRI CERVICAL SPINE WITHOUT AND WITH CONTRAST
TECHNIQUE: Multiplanar and multiecho pulse sequences of the cervical spine, to
include the craniocervical junction and cervicothoracic junction,
were obtained without and with intravenous contrast.
CONTRAST:  20mL MULTIHANCE GADOBENATE DIMEGLUMINE 529 MG/ML IV SOLN

[Series 2: STIR · sagittal · 3.0mm · 0.82mm/px · 2 of 15 slices shown]
[im 1/15]
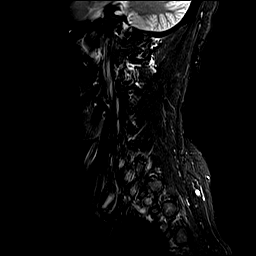
[im 5/15]
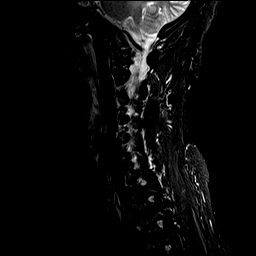

[Series 3: T1 · sagittal · 3.0mm · 0.41mm/px · 3 of 15 slices shown (1 of 2)]
[im 1/15]
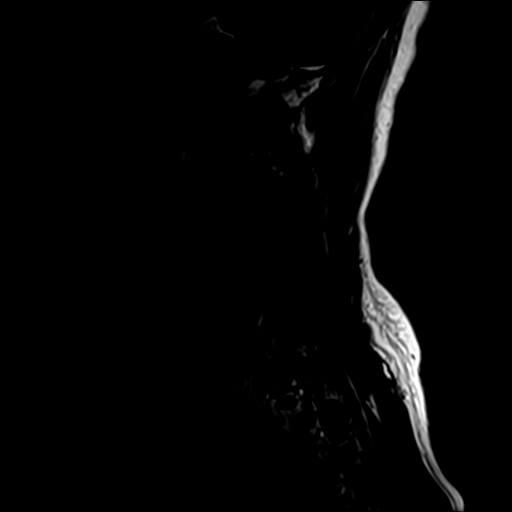
[im 8/15]
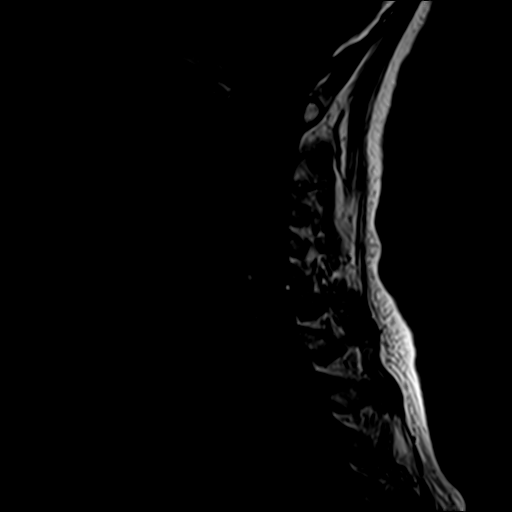
[im 15/15]
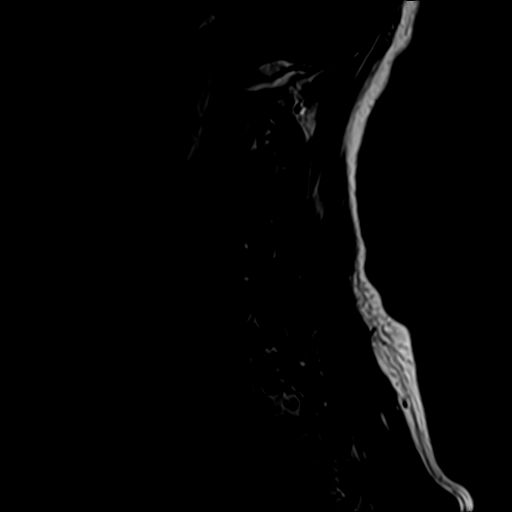

[Series 5: T2 · axial · 3.0mm · 0.70mm/px · z∈[-36,+93]mm · 7 of 36 slices shown (1 of 3)]
[im 1/36]
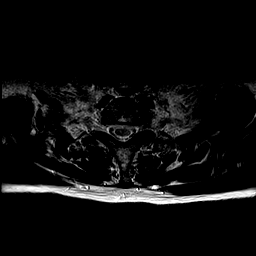
[im 6/36]
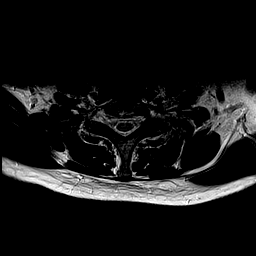
[im 12/36]
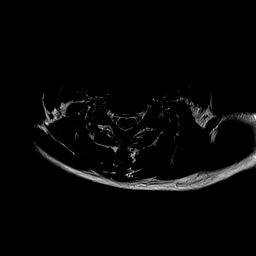
[im 18/36]
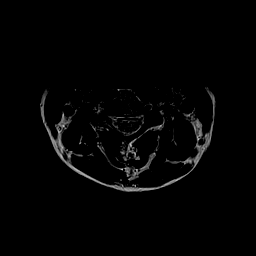
[im 24/36]
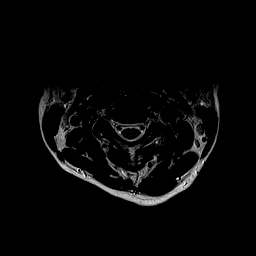
[im 30/36]
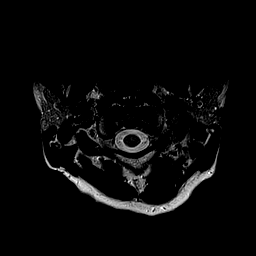
[im 36/36]
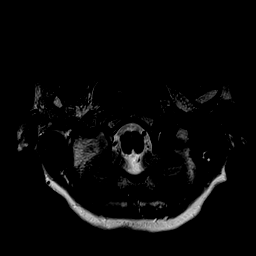

[Series 8: T1 · axial · 3.0mm · 0.35mm/px · z∈[-59,+77]mm · 7 of 38 slices shown (2 of 2)]
[im 1/38]
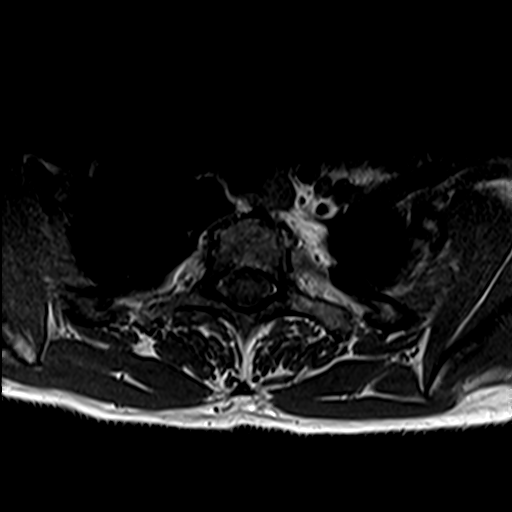
[im 7/38]
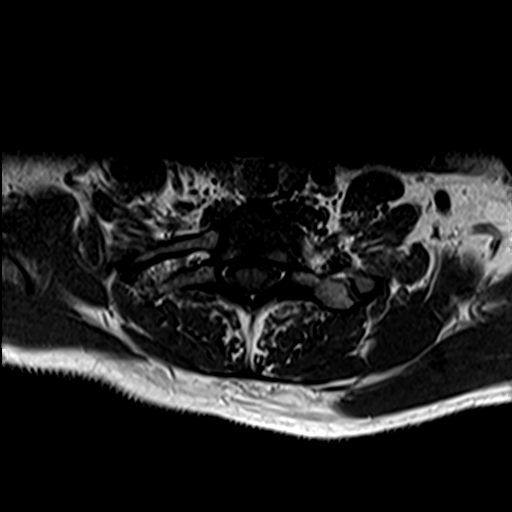
[im 13/38]
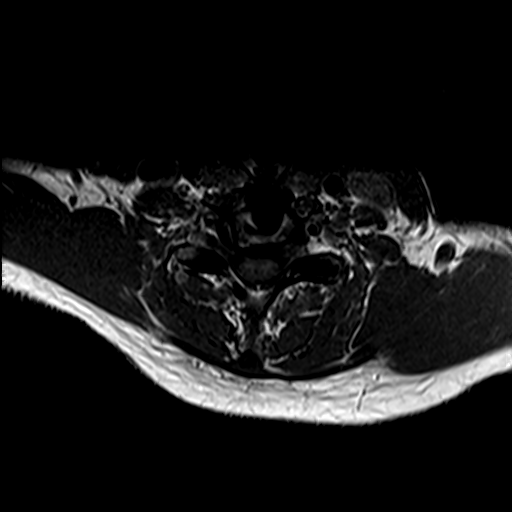
[im 19/38]
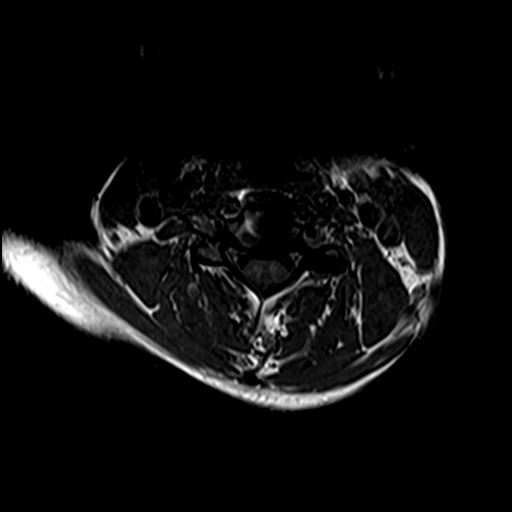
[im 25/38]
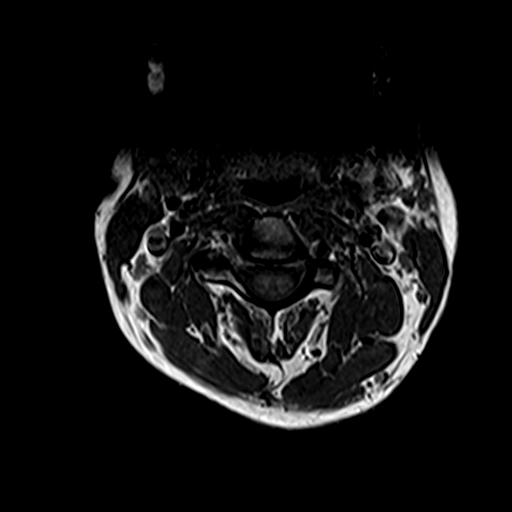
[im 31/38]
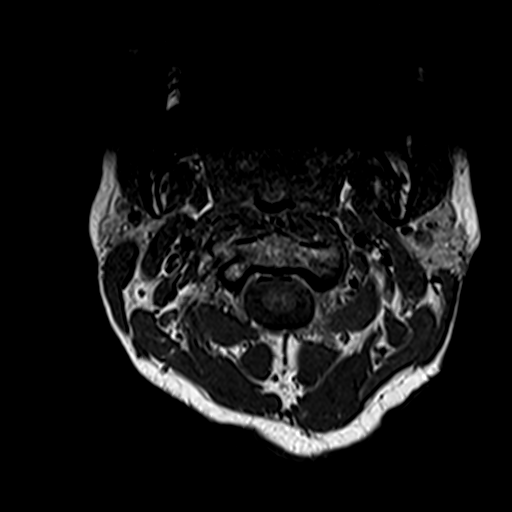
[im 38/38]
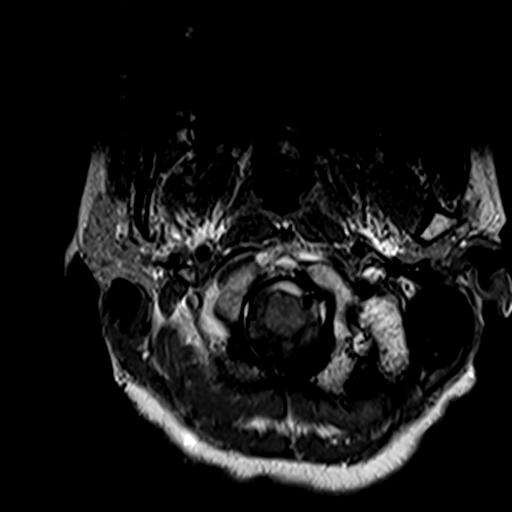

[Series 9: T2 · axial · 3.0mm · 0.70mm/px · z∈[-55,+73]mm · 7 of 36 slices shown (2 of 3)]
[im 1/36]
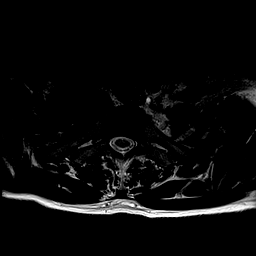
[im 6/36]
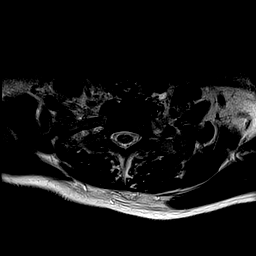
[im 12/36]
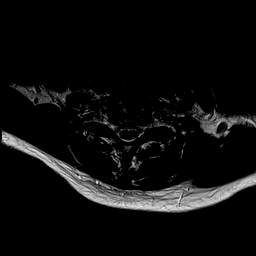
[im 18/36]
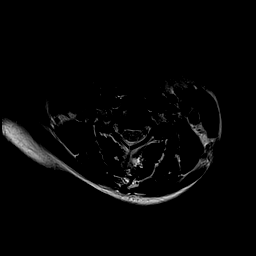
[im 24/36]
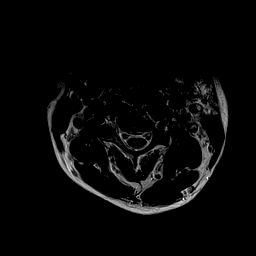
[im 30/36]
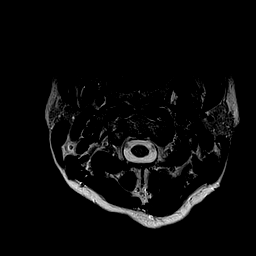
[im 36/36]
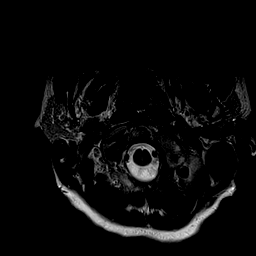

[Series 10: T2 · sagittal · 3.0mm · 0.41mm/px · 3 of 15 slices shown (3 of 3)]
[im 1/15]
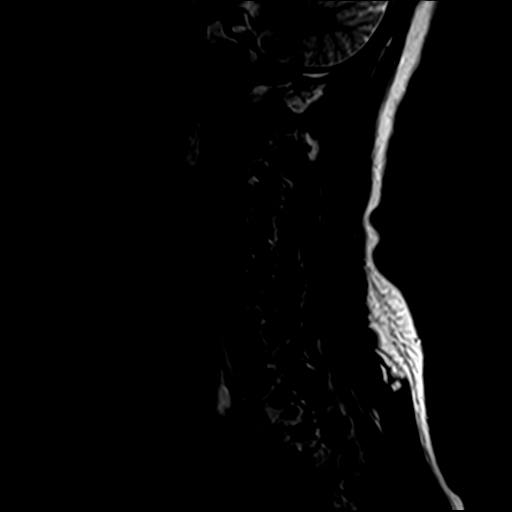
[im 8/15]
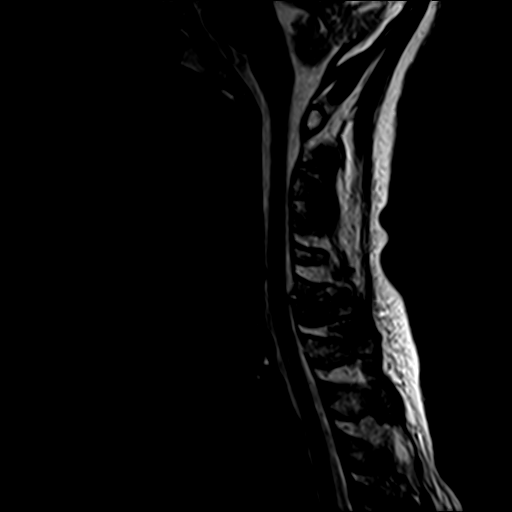
[im 15/15]
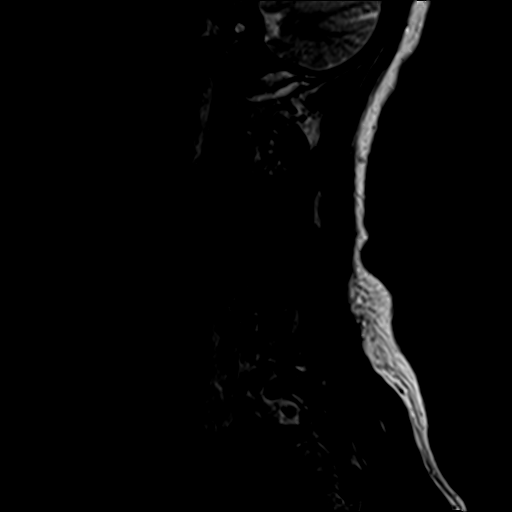

[Series 11: T1 fat-sat post-contrast · sagittal · 3.0mm · 0.82mm/px · 3 of 15 slices shown]
[im 1/15]
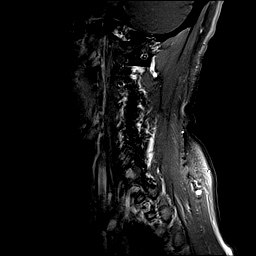
[im 8/15]
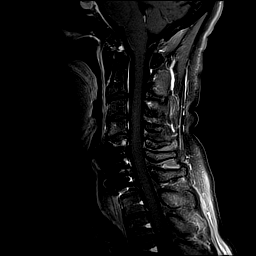
[im 15/15]
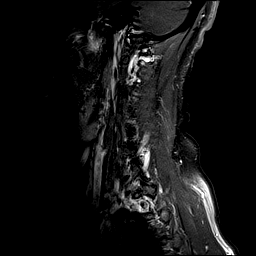

[32 of 48 positions shown; findings below may reference images not displayed]

FINDINGS: Alignment: Slight retrolisthesis is present at L4-5. No other
significant listhesis is present. Mild straightening of the normal
cervical lordosis again noted.

Vertebrae: Signal loss due to hardware at C5, C6 and C7. Marrow
signal and vertebral body heights are otherwise normal.

Cord: Normal signal and morphology.

Posterior Fossa, vertebral arteries, paraspinal tissues:
Craniocervical junction is normal. Flow is present in the vertebral
arteries bilaterally. Visualized intracranial contents are normal.

Disc levels:

C2-3: Negative.

C3-4: Mild uncovertebral spurring is present bilaterally without
significant stenosis.

C4-5: Broad-based disc osteophyte complex is present. Progressive
uncovertebral spurring results in moderate to severe right and
moderate left foraminal stenosis. Central canal is patent.

C5-6: Interval fusion. Central canal and right foramen are
decompressed.

C6-7: Interval fusion. The central canal and right foramen are
decompressed.

C7-T1: Negative.
IMPRESSION: 1. Interval fusion at C5-6 and C6-7 with decompression of the
central canal and right foramen at these levels.
2. Progressive uncovertebral spurring at C4-5 with moderate to
severe right and moderate left foraminal stenosis. Low back
3. Mild uncovertebral spurring bilaterally at C3-4 without
significant stenosis.

## 2020-06-11 MED ORDER — GADOBENATE DIMEGLUMINE 529 MG/ML IV SOLN
20.0000 mL | Freq: Once | INTRAVENOUS | Status: AC | PRN
  Administered 2020-06-11: 20 mL via INTRAVENOUS

## 2020-06-16 DIAGNOSIS — M654 Radial styloid tenosynovitis [de Quervain]: Secondary | ICD-10-CM | POA: Diagnosis not present

## 2020-06-16 DIAGNOSIS — M4322 Fusion of spine, cervical region: Secondary | ICD-10-CM | POA: Diagnosis not present

## 2020-06-16 DIAGNOSIS — M5412 Radiculopathy, cervical region: Secondary | ICD-10-CM | POA: Diagnosis not present

## 2020-06-16 DIAGNOSIS — G5600 Carpal tunnel syndrome, unspecified upper limb: Secondary | ICD-10-CM | POA: Diagnosis not present

## 2020-06-23 ENCOUNTER — Ambulatory Visit
Admission: EM | Admit: 2020-06-23 | Discharge: 2020-06-23 | Disposition: A | Discharge status: Home / Self Care | Payer: BC Managed Care – PPO | Attending: Family Medicine | Admitting: Family Medicine

## 2020-06-23 ENCOUNTER — Encounter: Payer: Self-pay | Admitting: Emergency Medicine

## 2020-06-23 DIAGNOSIS — M5442 Lumbago with sciatica, left side: Secondary | ICD-10-CM

## 2020-06-23 MED ORDER — DEXAMETHASONE SODIUM PHOSPHATE 10 MG/ML IJ SOLN
10.0000 mg | Freq: Once | INTRAMUSCULAR | Status: AC
  Administered 2020-06-23: 10 mg via INTRAMUSCULAR

## 2020-06-23 MED ORDER — KETOROLAC TROMETHAMINE 30 MG/ML IJ SOLN
30.0000 mg | Freq: Once | INTRAMUSCULAR | Status: AC
  Administered 2020-06-23: 30 mg via INTRAMUSCULAR

## 2020-06-23 MED ORDER — PREDNISONE 10 MG (21) PO TBPK: ORAL_TABLET | Freq: Every day | ORAL | 0 refills | Status: AC

## 2020-06-23 MED ORDER — CYCLOBENZAPRINE HCL 10 MG PO TABS: 10.0000 mg | ORAL_TABLET | Freq: Two times a day (BID) | ORAL | 0 refills | Status: DC | PRN

## 2020-06-23 NOTE — Discharge Instructions (Signed)
You have received a steroid injection in the office today as well as an injection for pain  I have sent in a prednisone taper for you to take for 6 days. 6 tablets on day one, 5 tablets on day two, 4 tablets on day three, 3 tablets on day four, 2 tablets on day five, and 1 tablet on day six.  I have sent in flexeril for you to take twice a day as needed for muscle spasms. This medication can make you sleepy. Do not drive or operate heavy machinery with this medication.  Follow up with this office or with primary care if symptoms are persisting.  Follow up in the ER for high fever, trouble swallowing, trouble breathing, other concerning symptoms.

## 2020-06-23 NOTE — ED Provider Notes (Signed)
[facility]   [id number] 06/23/20 Arrival Time: 1611  EP:PIRJJ PAIN  SUBJECTIVE: History from: patient. Debra Duncan is a 55 y.o. female complains of low back pain that began 3 days ago. States that pain radiates down to both hips. Denies a precipitating event or specific injury.   Describes the pain as constant and achy in character with intermittent sharp pains.  Has tried OTC medications without relief.  Symptoms are made worse with activity.  Denies similar symptoms in the past.  Denies fever, chills, erythema, ecchymosis, effusion, weakness, numbness and tingling, saddle paresthesias, loss of bowel or bladder function.      ROS: As per HPI.  All other pertinent ROS negative.     Past Medical History:  Diagnosis Date  . Hypertension    Past Surgical History:  Procedure Laterality Date  . btl     Allergies  Allergen Reactions  . Codeine Hives and Swelling   No current facility-administered medications on file prior to encounter.   Current Outpatient Medications on File Prior to Encounter  Medication Sig Dispense Refill  . EPINEPHrine (EPIPEN 2-PAK) 0.3 mg/0.3 mL IJ SOAJ injection     . HYDROcodone-acetaminophen (NORCO/VICODIN) 5-325 MG tablet Take 1 tablet by mouth every 4 (four) hours as needed. 10 tablet 0  . ibuprofen (ADVIL,MOTRIN) 600 MG tablet Take 1 tablet (600 mg total) by mouth every 6 (six) hours as needed. 30 tablet 0  . metroNIDAZOLE (FLAGYL) 500 MG tablet Take 1 tablet (500 mg total) by mouth 2 (two) times daily. 14 tablet 0  . Multiple Vitamins-Minerals (HAIR/SKIN/NAILS/BIOTIN PO) Take 1 tablet by mouth daily.    . ondansetron (ZOFRAN) 4 MG tablet Take 1 tablet (4 mg total) by mouth every 6 (six) hours. 12 tablet 0  . valsartan-hydrochlorothiazide (DIOVAN-HCT) 160-25 MG tablet Take 1 tablet by mouth daily.     Social History   Socioeconomic History  . Marital status: Single    Spouse name: Not on file  . Number of children: Not on file  . Years  of education: Not on file  . Highest education level: Not on file  Occupational History  . Not on file  Tobacco Use  . Smoking status: Never Smoker  . Smokeless tobacco: Never Used  Substance and Sexual Activity  . Alcohol use: No  . Drug use: No  . Sexual activity: Not on file  Other Topics Concern  . Not on file  Social History Narrative  . Not on file   Social Determinants of Health   Financial Resource Strain: Not on file  Food Insecurity: Not on file  Transportation Needs: Not on file  Physical Activity: Not on file  Stress: Not on file  Social Connections: Not on file  Intimate Partner Violence: Not on file   No family history on file.  OBJECTIVE:  Vitals:   06/23/20 1835  BP: 129/65  Pulse: 64  Resp: 18  Temp: 98 F (36.7 C)  SpO2: 98%    General appearance: ALERT; in no acute distress.  Head: NCAT Lungs: Normal respiratory effort CV: pulses 2+ bilaterally. Cap refill < 2 seconds Musculoskeletal:  Inspection: Skin warm, dry, clear and intact No erythema, effusion noted Palpation: bilateral low back tender to palpation ROM: Limited ROM active and passive to low back with bending, twisting, changing positions Skin: warm and dry Neurologic: Ambulates without difficulty; Sensation intact about the upper/ lower extremities Psychological: alert and cooperative; normal mood and affect  DIAGNOSTIC STUDIES:  No results found.  ASSESSMENT & PLAN:  1. Acute left-sided low back pain with left-sided sciatica     Meds ordered this encounter  Medications  . predniSONE (STERAPRED UNI-PAK 21 TAB) 10 MG (21) TBPK tablet    Sig: Take by mouth daily for 6 days. Take 6 tablets on day 1, 5 tablets on day 2, 4 tablets on day 3, 3 tablets on day 4, 2 tablets on day 5, 1 tablet on day 6    Dispense:  21 tablet    Refill:  0    Order Specific Question:   Supervising Provider    Answer:   Chase Picket [id number]  . ketorolac (TORADOL) 30 MG/ML injection 30 mg  .  dexamethasone (DECADRON) injection 10 mg  . cyclobenzaprine (FLEXERIL) 10 MG tablet    Sig: Take 1 tablet (10 mg total) by mouth 2 (two) times daily as needed for muscle spasms.    Dispense:  20 tablet    Refill:  0    Order Specific Question:   Supervising Provider    Answer:   Chase Picket [[id number]]   Toradol 30mg  IM in office today Decadron 10mg  IM in office today Steroid taper prescribed Flexeril prescribed Sedation precautions given Continue conservative management of rest, ice, and gentle stretches Take ibuprofen as needed for pain relief (may cause abdominal discomfort, ulcers, and GI bleeds avoid taking with other NSAIDs) Follow up with PCP if symptoms persist Return or go to the ER if you have any new or worsening symptoms (fever, chills, chest pain, abdominal pain, changes in bowel or bladder habits  Reviewed expectations re: course of current medical issues. Questions answered. Outlined signs and symptoms indicating need for more acute intervention. Patient verbalized understanding. After Visit Summary given.       Faustino Congress, NP 06/27/20 [phone]

## 2020-06-23 NOTE — ED Triage Notes (Signed)
back pain x3 days, no injury

## 2020-10-13 DIAGNOSIS — E7800 Pure hypercholesterolemia, unspecified: Secondary | ICD-10-CM | POA: Diagnosis not present

## 2020-10-13 DIAGNOSIS — E663 Overweight: Secondary | ICD-10-CM | POA: Diagnosis not present

## 2020-10-13 DIAGNOSIS — F064 Anxiety disorder due to known physiological condition: Secondary | ICD-10-CM | POA: Diagnosis not present

## 2020-10-13 DIAGNOSIS — I10 Essential (primary) hypertension: Secondary | ICD-10-CM | POA: Diagnosis not present

## 2020-12-12 ENCOUNTER — Other Ambulatory Visit (HOSPITAL_COMMUNITY)
Admission: RE | Admit: 2020-12-12 | Discharge: 2020-12-12 | Disposition: A | Discharge status: Home / Self Care | Payer: BC Managed Care – PPO | Source: Ambulatory Visit | Attending: Family Medicine | Admitting: Family Medicine

## 2020-12-12 ENCOUNTER — Other Ambulatory Visit: Payer: Self-pay | Admitting: Family Medicine

## 2020-12-12 DIAGNOSIS — N898 Other specified noninflammatory disorders of vagina: Secondary | ICD-10-CM | POA: Insufficient documentation

## 2020-12-15 LAB — MOLECULAR ANCILLARY ONLY
Bacterial Vaginitis (gardnerella): NEGATIVE
Candida Glabrata: NEGATIVE
Candida Vaginitis: NEGATIVE
Chlamydia: NEGATIVE
Comment: NEGATIVE
Comment: NEGATIVE
Comment: NEGATIVE
Comment: NEGATIVE
Comment: NEGATIVE
Comment: NORMAL
Neisseria Gonorrhea: NEGATIVE
Trichomonas: NEGATIVE

## 2021-09-09 ENCOUNTER — Ambulatory Visit: Payer: Self-pay | Admitting: Podiatry

## 2021-09-23 ENCOUNTER — Other Ambulatory Visit: Payer: Self-pay | Admitting: Family Medicine

## 2021-09-23 ENCOUNTER — Other Ambulatory Visit (HOSPITAL_COMMUNITY)
Admission: RE | Admit: 2021-09-23 | Discharge: 2021-09-23 | Disposition: A | Discharge status: Home / Self Care | Payer: BC Managed Care – PPO | Source: Ambulatory Visit | Attending: Family Medicine | Admitting: Family Medicine

## 2021-09-23 DIAGNOSIS — Z124 Encounter for screening for malignant neoplasm of cervix: Secondary | ICD-10-CM | POA: Insufficient documentation

## 2021-10-01 LAB — CYTOLOGY - PAP
Comment: NEGATIVE
High risk HPV: NEGATIVE

## 2021-11-18 ENCOUNTER — Encounter: Payer: Self-pay | Admitting: Obstetrics and Gynecology

## 2021-11-18 ENCOUNTER — Ambulatory Visit: Payer: BC Managed Care – PPO | Admitting: Obstetrics and Gynecology

## 2021-11-18 ENCOUNTER — Other Ambulatory Visit (HOSPITAL_COMMUNITY)
Admission: RE | Admit: 2021-11-18 | Discharge: 2021-11-18 | Disposition: A | Discharge status: Home / Self Care | Payer: BC Managed Care – PPO | Source: Ambulatory Visit | Attending: Obstetrics | Admitting: Obstetrics

## 2021-11-18 DIAGNOSIS — N870 Mild cervical dysplasia: Secondary | ICD-10-CM | POA: Insufficient documentation

## 2021-11-18 NOTE — Progress Notes (Signed)
Patient given informed consent, signed copy in the chart, time out was performed.  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  Pt is of advanced age and has had BTL.  She was placed in lithotomy position. Cervix viewed with speculum and colposcope after application of acetic acid and lugol's solution.  Small wedge on nonstaining with lugol's seen just anterior to the cervical os from 11-2 o' clock.   Colposcopy adequate?  yes Acetowhite lesions? None apparent, nonstaining with Lugol's solution, see above. Punctation? no Mosaicism?  no Abnormal vasculature?  no Biopsies? Yes , 1 at 1 o'clock ECC? no  COMMENTS: Patient was given post procedure instructions.  Further treatment dependent on biopsy results.  Griffin Basil, MD

## 2021-11-19 LAB — SURGICAL PATHOLOGY

## 2022-03-08 ENCOUNTER — Other Ambulatory Visit (HOSPITAL_COMMUNITY)
Admission: RE | Admit: 2022-03-08 | Discharge: 2022-03-08 | Disposition: A | Discharge status: Home / Self Care | Payer: BC Managed Care – PPO | Source: Ambulatory Visit | Attending: Obstetrics | Admitting: Obstetrics

## 2022-03-08 ENCOUNTER — Encounter: Payer: Self-pay | Admitting: Obstetrics

## 2022-03-08 ENCOUNTER — Ambulatory Visit: Payer: BC Managed Care – PPO | Admitting: Obstetrics

## 2022-03-08 VITALS — BP 141/73 | HR 72 | Wt 212.0 lb

## 2022-03-08 DIAGNOSIS — Z113 Encounter for screening for infections with a predominantly sexual mode of transmission: Secondary | ICD-10-CM

## 2022-03-08 DIAGNOSIS — N950 Postmenopausal bleeding: Secondary | ICD-10-CM

## 2022-03-08 DIAGNOSIS — Z01419 Encounter for gynecological examination (general) (routine) without abnormal findings: Secondary | ICD-10-CM

## 2022-03-08 DIAGNOSIS — N898 Other specified noninflammatory disorders of vagina: Secondary | ICD-10-CM | POA: Insufficient documentation

## 2022-03-08 MED ORDER — METRONIDAZOLE 500 MG PO TABS: 500.0000 mg | ORAL_TABLET | Freq: Two times a day (BID) | ORAL | 2 refills | Status: AC

## 2022-03-08 NOTE — Progress Notes (Unsigned)
Subjective:        Debra Duncan is a 57 y.o. female here for a routine exam.  Current complaints: Vaginal bleeding after intercourse 3  days ago.  Also c/o malodorous vaginal discharge.  Had Endometrial Ablation ~ 15 years ago, and has not had periods since then.   She denies pelvic pain, dysuria or other complaints.  Personal health questionnaire:  Is patient Ashkenazi Jewish, have a family history of breast and/or ovarian cancer: no Is there a family history of uterine cancer diagnosed at age < 1, gastrointestinal cancer, urinary tract cancer, family member who is a Field seismologist syndrome-associated carrier: no Is the patient overweight and hypertensive, family history of diabetes, personal history of gestational diabetes, preeclampsia or PCOS: no Is patient over 70, have PCOS,  family history of premature CHD under age 46, diabetes, smoke, have hypertension or peripheral artery disease:  no At any time, has a partner hit, kicked or otherwise hurt or frightened you?: no Over the past 2 weeks, have you felt down, depressed or hopeless?: no Over the past 2 weeks, have you felt little interest or pleasure in doing things?:no   Gynecologic History No LMP recorded. Patient has had an ablation. Contraception: post menopausal status Last Pap: 2023. Results were: LGSIL.  Colposcopic Biopsy = LGSIL Last mammogram: 2023. Results were: normal  Obstetric History OB History  Gravida Para Term Preterm AB Living  3         3  SAB IAB Ectopic Multiple Live Births               # Outcome Date GA Lbr Len/2nd Weight Sex Delivery Anes PTL Lv  3 Gravida           2 Gravida           1 [place]             Past Medical History:  Diagnosis Date   Hypertension     Past Surgical History:  Procedure Laterality Date   btl       Current Outpatient Medications:    metroNIDAZOLE (FLAGYL) 500 MG tablet, Take 1 tablet (500 mg total) by mouth 2 (two) times daily., Disp: 14 tablet, Rfl: 2   Multiple  Vitamins-Minerals (HAIR/SKIN/NAILS/BIOTIN PO), Take 1 tablet by mouth daily., Disp: , Rfl:    rosuvastatin (CRESTOR) 10 MG tablet, Take 10 mg by mouth daily., Disp: , Rfl:    valsartan-hydrochlorothiazide (DIOVAN-HCT) 160-25 MG tablet, Take 1 tablet by mouth daily., Disp: , Rfl:    EPINEPHrine (EPIPEN 2-PAK) 0.3 mg/0.3 mL IJ SOAJ injection, , Disp: , Rfl:  Allergies  Allergen Reactions   Codeine Hives and Swelling   Doxycycline Hives, Itching and Swelling    Social History   Tobacco Use   Smoking status: Never   Smokeless tobacco: Never  Substance Use Topics   Alcohol use: No    History reviewed. No pertinent family history.    Review of Systems  Constitutional: negative for fatigue and weight loss Respiratory: negative for cough and wheezing Cardiovascular: negative for chest pain, fatigue and palpitations Gastrointestinal: negative for abdominal pain and change in bowel habits Musculoskeletal:negative for myalgias Neurological: negative for gait problems and tremors Behavioral/Psych: negative for abusive relationship, depression Endocrine: negative for temperature intolerance    Genitourinary: positive for abnormal vaginal bleeding and malodorous vaginal discharge.  Negative for genital lesions, hot flashes, sexual problems  Integument/breast: negative for breast lump, breast tenderness, nipple discharge and skin lesion(s)    Objective:  BP (!) 141/73   Pulse 72   Wt 212 lb (96.2 kg)   BMI 30.42 kg/m  General:   Alert and no distress  Skin:   no rash or abnormalities  Lungs:   clear to auscultation bilaterally  Heart:   regular rate and rhythm, S1, S2 normal, no murmur, click, rub or gallop  Breasts:   normal without suspicious masses, skin or nipple changes or axillary nodes  Abdomen:  normal findings: no organomegaly, soft, non-tender and no hernia  Pelvis:  External genitalia: normal general appearance Urinary system: urethral meatus normal and bladder  without fullness, nontender Vaginal: normal without tenderness, induration or masses Cervix: normal appearance Adnexa: normal bimanual exam Uterus: anteverted and non-tender, normal size   Lab Review Urine pregnancy test Labs reviewed yes Radiologic studies reviewed yes  I have spent a total of 25 minutes of face-to-face time, excluding clinical staff time, reviewing notes and preparing to see patient, ordering tests and/or medications, and counseling the patient.   Assessment:    1. Encounter for annual routine gynecological examination  2. Postmenopausal bleeding Rx: - US PELVIC COMPLETE WITH TRANSVAGINAL; Future  3. Vaginal discharge Rx: - Cervicovaginal ancillary only( [place]) - metroNIDAZOLE (FLAGYL) 500 MG tablet; Take 1 tablet (500 mg total) by mouth 2 (two) times daily.  Dispense: 14 tablet; Refill: 2  4. Screen for STD (sexually transmitted disease) Rx: - Hepatitis B surface antigen - Hepatitis C antibody - RPR - HIV Antibody (routine testing w rflx)     Plan:    Education reviewed: calcium supplements, depression evaluation, low fat, low cholesterol diet, safe sex/STD prevention, self breast exams, and weight bearing exercise. Follow up in: 3 weeks.   Meds ordered this encounter  Medications   metroNIDAZOLE (FLAGYL) 500 MG tablet    Sig: Take 1 tablet (500 mg total) by mouth 2 (two) times daily.    Dispense:  14 tablet    Refill:  2   Orders Placed This Encounter  Procedures   US PELVIC COMPLETE WITH TRANSVAGINAL    Standing Status:   Future    Standing Expiration Date:   03/09/2023    Order Specific Question:   Reason for Exam (SYMPTOM  OR DIAGNOSIS REQUIRED)    Answer:   Postmenopausal Bleeding, postcoital.    Order Specific Question:   Preferred imaging location?    Answer:   [facility]   Hepatitis B surface antigen   Hepatitis C antibody   RPR   HIV Antibody (routine testing w rflx)     Shelly Bombard, MD 03/09/2022 6:13 AM

## 2022-03-08 NOTE — Progress Notes (Unsigned)
Pt reports VB after IC on Saturday and malodorous vaginal discharge.

## 2022-03-09 LAB — HEPATITIS B SURFACE ANTIGEN: Hepatitis B Surface Ag: NEGATIVE

## 2022-03-09 LAB — HIV ANTIBODY (ROUTINE TESTING W REFLEX): HIV Screen 4th Generation wRfx: NONREACTIVE

## 2022-03-09 LAB — HEPATITIS C ANTIBODY: Hep C Virus Ab: NONREACTIVE

## 2022-03-09 LAB — RPR: RPR Ser Ql: NONREACTIVE

## 2022-03-10 LAB — CERVICOVAGINAL ANCILLARY ONLY
Bacterial Vaginitis (gardnerella): POSITIVE — AB
Candida Glabrata: NEGATIVE
Candida Vaginitis: NEGATIVE
Chlamydia: NEGATIVE
Comment: NEGATIVE
Comment: NEGATIVE
Comment: NEGATIVE
Comment: NEGATIVE
Comment: NEGATIVE
Comment: NORMAL
Neisseria Gonorrhea: NEGATIVE
Trichomonas: NEGATIVE

## 2022-03-11 ENCOUNTER — Other Ambulatory Visit: Payer: Self-pay | Admitting: Obstetrics

## 2022-03-15 ENCOUNTER — Telehealth: Payer: Self-pay | Admitting: Emergency Medicine

## 2022-03-15 NOTE — Telephone Encounter (Signed)
Attempted TC to patient, LVM.

## 2022-03-15 NOTE — Telephone Encounter (Signed)
Pt informed of results.

## 2022-03-15 NOTE — Telephone Encounter (Signed)
-----   Message from Shelly Bombard, MD sent at 03/11/2022  8:35 AM EST ----- Flagyl Rx for BV

## 2022-03-16 ENCOUNTER — Ambulatory Visit
Admission: RE | Admit: 2022-03-16 | Discharge: 2022-03-16 | Disposition: A | Discharge status: Home / Self Care | Payer: BC Managed Care – PPO | Source: Ambulatory Visit | Attending: Obstetrics | Admitting: Obstetrics

## 2022-03-16 DIAGNOSIS — N950 Postmenopausal bleeding: Secondary | ICD-10-CM

## 2022-11-24 ENCOUNTER — Ambulatory Visit (INDEPENDENT_AMBULATORY_CARE_PROVIDER_SITE_OTHER): Payer: BC Managed Care – PPO | Admitting: Allergy

## 2022-11-24 ENCOUNTER — Other Ambulatory Visit: Payer: Self-pay | Admitting: Allergy

## 2022-11-24 ENCOUNTER — Other Ambulatory Visit: Payer: Self-pay

## 2022-11-24 ENCOUNTER — Encounter: Payer: Self-pay | Admitting: Allergy

## 2022-11-24 VITALS — BP 102/64 | HR 70 | Temp 98.7°F | Ht 70.0 in | Wt 214.4 lb

## 2022-11-24 DIAGNOSIS — T782XXD Anaphylactic shock, unspecified, subsequent encounter: Secondary | ICD-10-CM | POA: Diagnosis not present

## 2022-11-24 DIAGNOSIS — T63481D Toxic effect of venom of other arthropod, accidental (unintentional), subsequent encounter: Secondary | ICD-10-CM

## 2022-11-24 DIAGNOSIS — T781XXD Other adverse food reactions, not elsewhere classified, subsequent encounter: Secondary | ICD-10-CM | POA: Diagnosis not present

## 2022-11-24 DIAGNOSIS — L508 Other urticaria: Secondary | ICD-10-CM

## 2022-11-24 MED ORDER — CETIRIZINE HCL 10 MG PO TABS: 10.0000 mg | ORAL_TABLET | Freq: Every day | ORAL | 5 refills | Status: AC

## 2022-11-24 MED ORDER — FAMOTIDINE 20 MG PO TABS: 20.0000 mg | ORAL_TABLET | Freq: Every day | ORAL | 5 refills | Status: AC

## 2022-11-24 MED ORDER — EPINEPHRINE 0.3 MG/0.3ML IJ SOAJ: 0.3000 mg | INTRAMUSCULAR | 1 refills | Status: DC | PRN

## 2022-11-24 NOTE — Patient Instructions (Addendum)
Insect Sting Allergy History of severe reaction to yellow jacket sting in 2013 with throat involvement. Currently carries an EpiPen. -Continue to carry EpiPen and use as needed for severe reactions. -Follow emergency action plan in case of reaction -Order stinging insect panel to assess reactivity.  Chronic hives and swelling Reports oral symptoms (tingling, swelling) and systemic symptoms (rash, hives) after eating certain foods, particularly spicy foods. -At this time etiology of hives and swelling is unknown.  Hives/swelling can be caused by a variety of different triggers including illness/infection, foods, medications, stings, exercise, pressure, vibrations, extremes of temperature to name a few however majority of the time there is no identifiable trigger.   Your symptoms have been ongoing for >6 weeks making this chronic thus will obtain labwork to evaluate: CBC w diff, CMP, tryptase, hive panel, environmental panel, alpha-gal panel, inflammatory markers and food allergy panel -Consider food challenges in a monitored environment if testing is negative if not comfortable reintroducing foods at home. -For symptom control recommend taking Zyrtec 10mg  1 tablet daily with Pepcid 20mg  1 tablet daily -If symptoms persist despite antihistamine regimen then may need to step up therapy which could include starting Xolair injections for chronic spontaneous hives control. -Should significant symptoms recur or new symptoms occur, a journal is to be kept recording any foods eaten, beverages consumed, medications taken, activities performed, and environmental conditions within a 6 hour time period prior to the onset of symptoms. For any symptoms concerning for anaphylaxis, epinephrine is to be administered and 911 is to be called immediately.  Follow-up in 3 months or sooner if needed

## 2022-11-24 NOTE — Progress Notes (Signed)
New Patient Note  RE: Debra Duncan MRN: [id number] DOB: Dec 22, 1965 Date of Office Visit: 11/24/2022   Primary care provider: Renaye Rakers, MD  Chief Complaint: allergies  History of present illness: Debra Duncan is a 57 y.o. female presenting today for evaluation of allergy.   She presents with a history of allergic reactions, including a significant reaction to a yellow jacket sting in 2013 that resulted in an emergency room visit due to throat tightness and difficulty breathing. She has been prescribed an EpiPen for this.  Since this she feels like her allergies are just doing worse and worse.  More recently, she has been experiencing recurrent episodes of hives, swelling, and oral symptoms, including tingling and soreness of the tongue and lips, following the consumption of various foods. These reactions have been noted with spicy foods, salmon, and cashews, among others. The patient reports immediate onset of symptoms following ingestion, with one notable episode of eye swelling after consuming salmon that had something red on top and some type of crushed nut at a dinner event.  She took 1 bite and had immediate symptoms and had to take 2-3 Benadryl.  She is not sure what she can eat anymore.  She states the only foods that she knows she can eat her chicken and sweet potato basically.  She cooks most of her food that she does not know what type of seasonings or spices restaurants and other people use.  She states she can eat salad and fruit as does not notice much issues with these.   She states her PCP does not want her eating a lot of bread due to her cholesterol thus she has cut this type of food items from her diet. When she describes the hives they can be red and itchy and raised and cannot appear in different body areas.  She has noted on her back and in the groin area. She showed me an area on her back where there is a dark mark from where she had the hives and was scratching a lot.   If she does not really scratch the hives do not leave behind any marks or bruising.  She does reports recent joint pain in the knees, particularly when climbing stairs.  Denies having fevers.  She denies having worsening hives or swelling episodes when she has had illnesses.  The patient has a history of eczema as a child, which was triggered by consumption of chocolate and caffeine and contact with wool. The eczema was managed by leaving the skin slightly damp after bathing and applying a cream.  She normally has issues with eczema.  However she states her skin lately appears to be more dry.  Review of systems: 10pt ROS negative unless noted above in HPI  All other systems negative unless noted above in HPI  Past medical history: Past Medical History:  Diagnosis Date   Hypertension     Past surgical history: Past Surgical History:  Procedure Laterality Date   btl      Family history:  History reviewed. No pertinent family history.  Social history: Lives in a home without carpeting with electric heating and window cooling.  No pets in the home.  There is no concern for water damage, mildew or roaches in the home.  She is an Midwife.  She does not report a smoking history.  Medication List: Current Outpatient Medications  Medication Sig Dispense Refill   Multiple Vitamins-Minerals (HAIR/SKIN/NAILS/BIOTIN PO) Take 1 tablet by mouth daily.  rosuvastatin (CRESTOR) 10 MG tablet Take 10 mg by mouth daily.     triamcinolone ointment (KENALOG) 0.1 % Apply 1 Application topically 2 (two) times daily.     valsartan-hydrochlorothiazide (DIOVAN-HCT) 160-25 MG tablet Take 1 tablet by mouth daily.     EPINEPHrine (EPIPEN 2-PAK) 0.3 mg/0.3 mL IJ SOAJ injection  (Patient not taking: Reported on 03/08/2022)     metroNIDAZOLE (FLAGYL) 500 MG tablet Take 1 tablet (500 mg total) by mouth 2 (two) times daily. 14 tablet 2   No current facility-administered medications for this visit.    Known  medication allergies: Allergies  Allergen Reactions   Codeine Hives and Swelling   Doxycycline Hives, Itching and Swelling     Physical examination: Blood pressure 102/64, pulse 70, temperature 98.7 F (37.1 C), temperature source Temporal, height 5\' 10"  (1.778 m), weight 214 lb 6.4 oz (97.3 kg), SpO2 98%.  General: Alert, interactive, in no acute distress. HEENT: PERRLA, TMs pearly gray, turbinates non-edematous without discharge, post-pharynx non erythematous. Neck: Supple without lymphadenopathy. Lungs: Clear to auscultation without wheezing, rhonchi or rales. {no increased work of breathing. CV: Normal S1, S2 without murmurs. Abdomen: Nondistended, nontender. Skin: Linear hyperpigmented mark on the right mid back in the bra line area . Extremities:  No clubbing, cyanosis or edema. Neuro:   Grossly intact.  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 Currently carries an EpiPen. -Continue to carry EpiPen and use as needed for severe reactions. -Follow emergency action plan in case of reaction -Order stinging insect panel to assess reactivity.  Chronic urticaria/angioedema Reports oral symptoms (tingling, swelling) and systemic symptoms (rash, hives) after eating certain foods, particularly spicy foods. -At this time etiology of hives and swelling is unknown.  Hives/swelling can be caused by a variety of different triggers including illness/infection, foods, medications, stings, exercise, pressure, vibrations, extremes of temperature to name a few however majority of the time there is no identifiable trigger.   Your symptoms have been ongoing for >6 weeks making this chronic thus will obtain labwork to evaluate: CBC w diff, CMP, tryptase, hive panel, environmental panel, alpha-gal panel, inflammatory markers and food allergy panel -Consider food challenges in a  monitored environment if testing is negative if not comfortable reintroducing foods at home. -For symptom control recommend taking Zyrtec 10mg  1 tablet daily with Pepcid 20mg  1 tablet daily -If symptoms persist despite antihistamine regimen then may need to step up therapy which could include starting Xolair injections for chronic spontaneous hives control. -Should significant symptoms recur or new symptoms occur, a journal is to be kept recording any foods eaten, beverages consumed, medications taken, activities performed, and environmental conditions within a 6 hour time period prior to the onset of symptoms. For any symptoms concerning for anaphylaxis, epinephrine is to be administered and 911 is to be called immediately.  Follow-up in 3 months or sooner if needed   I appreciate the opportunity to take part in Debra Duncan's care. Please do not hesitate to contact me with questions.  Sincerely,   Margo Aye, MD Allergy/Immunology [facility]

## 2022-11-25 ENCOUNTER — Other Ambulatory Visit (HOSPITAL_COMMUNITY): Payer: Self-pay

## 2022-11-25 NOTE — Telephone Encounter (Signed)
Does not require a prior authorization. Pharmacy must process with an NDC that is NOT [employer] or [employer] to get a paid claim.

## 2022-12-02 LAB — ALLERGEN PROFILE, FOOD-SPICES
Allergen Black Pepper IgE: 0.16 kU/L — AB
Allergen Cinnamon IgE: <0.1 kU/L
Allergen Garlic IgE: 0.26 kU/L — AB
Allergen Ginger IgE: 0.29 kU/L — AB
Paprika IgE: 0.25 kU/L — AB
Vanilla: 0.12 kU/L — AB

## 2022-12-02 LAB — IGE FOOD BASIC W/COMPONENT RFX
F001-IgE Egg White: <0.1 kU/L
F002-IgE Milk: <0.1 kU/L
Soybean IgE: 0.22 kU/L — AB
Wheat IgE: 0.22 kU/L — AB

## 2022-12-02 LAB — ALLERGENS W/TOTAL IGE AREA 2
Alternaria Alternata IgE: <0.1 kU/L
Aspergillus Fumigatus IgE: <0.1 kU/L
Bermuda Grass IgE: 0.25 kU/L — AB
Cat Dander IgE: <0.1 kU/L
Cedar, Mountain IgE: 0.26 kU/L — AB
Cladosporium Herbarum IgE: <0.1 kU/L
Cockroach, German IgE: 0.24 kU/L — AB
Common Silver Birch IgE: 0.25 kU/L — AB
Cottonwood IgE: 0.28 kU/L — AB
D Farinae IgE: 0.14 kU/L — AB
D Pteronyssinus IgE: <0.1 kU/L
Dog Dander IgE: <0.1 kU/L
Elm, American IgE: 0.23 kU/L — AB
Johnson Grass IgE: 0.21 kU/L — AB
Maple/Box Elder IgE: 0.26 kU/L — AB
Mouse Urine IgE: <0.1 kU/L
Oak, White IgE: 0.26 kU/L — AB
Pecan, Hickory IgE: 0.23 kU/L — AB
Penicillium Chrysogen IgE: <0.1 kU/L
Pigweed, Rough IgE: 0.22 kU/L — AB
Ragweed, Short IgE: 0.3 kU/L — AB
Sheep Sorrel IgE Qn: 0.26 kU/L — AB
Timothy Grass IgE: 0.26 kU/L — AB
White Mulberry IgE: 0.23 kU/L — AB

## 2022-12-02 LAB — CBC WITH DIFFERENTIAL/PLATELET
Basophils Absolute: 0 10*3/uL (ref 0.0–0.2)
Basos: 0 %
EOS (ABSOLUTE): 0.1 10*3/uL (ref 0.0–0.4)
Eos: 2 %
Hematocrit: 39.9 % (ref 34.0–46.6)
Hemoglobin: 12.3 g/dL (ref 11.1–15.9)
Immature Grans (Abs): 0 10*3/uL (ref 0.0–0.1)
Immature Granulocytes: 0 %
Lymphocytes Absolute: 1.5 10*3/uL (ref 0.7–3.1)
Lymphs: 23 %
MCH: 26.3 pg — ABNORMAL LOW (ref 26.6–33.0)
MCHC: 30.8 g/dL — ABNORMAL LOW (ref 31.5–35.7)
MCV: 85 fL (ref 79–97)
Monocytes Absolute: 0.5 10*3/uL (ref 0.1–0.9)
Monocytes: 7 %
Neutrophils Absolute: 4.3 10*3/uL (ref 1.4–7.0)
Neutrophils: 68 %
Platelets: 263 10*3/uL (ref 150–450)
RBC: 4.68 x10E6/uL (ref 3.77–5.28)
RDW: 13.1 % (ref 11.7–15.4)
WBC: 6.4 10*3/uL (ref 3.4–10.8)

## 2022-12-02 LAB — HYMENOPTERA VENOM ALLERGY II
Bumblebee: 0.25 kU/L — AB
Hornet, White Face, IgE: 0.39 kU/L — AB
Hornet, Yellow, IgE: 0.32 kU/L — AB
I001-IgE Honeybee: 0.32 kU/L — AB
I003-IgE Yellow Jacket: 0.3 kU/L — AB
I004-IgE Paper Wasp: 0.12 kU/L — AB
I208-IgE Api m 1: <0.1 kU/L
I209-IgE Ves v 5: <0.1 kU/L
I210-IgE Pol d 5: <0.1 kU/L
I211-IgE Ves v 1: <0.1 kU/L
I214-IgE Api m 2: <0.1 kU/L
I215-IgE Api m 3: <0.1 kU/L
I216-IgE Api m 5: 0.33 kU/L — AB
I217-IgE Api m 10: <0.1 kU/L
Tryptase: 4.5 ug/L (ref 2.2–13.2)

## 2022-12-02 LAB — COMPREHENSIVE METABOLIC PANEL
ALT: 12 [IU]/L (ref 0–32)
AST: 21 [IU]/L (ref 0–40)
Albumin: 4.6 g/dL (ref 3.8–4.9)
Alkaline Phosphatase: 53 [IU]/L (ref 44–121)
BUN/Creatinine Ratio: 17 (ref 9–23)
BUN: 19 mg/dL (ref 6–24)
Bilirubin Total: 0.2 mg/dL (ref 0.0–1.2)
CO2: 26 mmol/L (ref 20–29)
Calcium: 9.7 mg/dL (ref 8.7–10.2)
Chloride: 102 mmol/L (ref 96–106)
Creatinine, Ser: 1.09 mg/dL — ABNORMAL HIGH (ref 0.57–1.00)
Globulin, Total: 3.2 g/dL (ref 1.5–4.5)
Glucose: 73 mg/dL (ref 70–99)
Potassium: 4 mmol/L (ref 3.5–5.2)
Sodium: 140 mmol/L (ref 134–144)
Total Protein: 7.8 g/dL (ref 6.0–8.5)
eGFR: 59 mL/min/{1.73_m2} — ABNORMAL LOW (ref >59)

## 2022-12-02 LAB — ALLERGEN COMPONENT COMMENTS

## 2022-12-02 LAB — THYROID ANTIBODIES (THYROPEROXIDASE & THYROGLOBULIN): Thyroglobulin Antibody: <1 [IU]/mL (ref 0.0–0.9)

## 2022-12-02 LAB — ALPHA-GAL PANEL
Allergen Lamb IgE: <0.1 kU/L
Beef IgE: <0.1 kU/L
IgE (Immunoglobulin E), Serum: 40 [IU]/mL (ref 6–495)
O215-IgE Alpha-Gal: <0.1 kU/L
Pork IgE: <0.1 kU/L

## 2022-12-02 LAB — PEANUT COMPONENTS
F352-IgE Ara h 8: <0.1 kU/L
F422-IgE Ara h 1: <0.1 kU/L
F423-IgE Ara h 2: <0.1 kU/L
F424-IgE Ara h 3: <0.1 kU/L
F427-IgE Ara h 9: <0.1 kU/L
F447-IgE Ara h 6: <0.1 kU/L

## 2022-12-02 LAB — PANEL 604726
Cor A 1 IgE: <0.1 kU/L
Cor A 14 IgE: <0.1 kU/L
Cor A 8 IgE: <0.1 kU/L
Cor A 9 IgE: 0.19 kU/L — AB

## 2022-12-02 LAB — ALLERGEN PROFILE, FOOD-FISH
Allergen Mackerel IgE: <0.1 kU/L
Allergen Salmon IgE: <0.1 kU/L
Allergen Trout IgE: <0.1 kU/L
Allergen Walley Pike IgE: <0.1 kU/L
Codfish IgE: <0.1 kU/L
Halibut IgE: <0.1 kU/L
Tuna: <0.1 kU/L

## 2022-12-02 LAB — PANEL 604350: Ber E 1 IgE: <0.1 kU/L

## 2022-12-02 LAB — IGE NUT PROF. W/COMPONENT RFLX
F017-IgE Hazelnut (Filbert): 0.24 kU/L — AB
F018-IgE Brazil Nut: 0.13 kU/L — AB
F020-IgE Almond: 0.28 kU/L — AB
F202-IgE Cashew Nut: 0.22 kU/L — AB
F203-IgE Pistachio Nut: 0.32 kU/L — AB
F256-IgE Walnut: 0.26 kU/L — AB
Macadamia Nut, IgE: 0.27 kU/L — AB
Peanut, IgE: 0.26 kU/L — AB
Pecan Nut IgE: 0.15 kU/L — AB

## 2022-12-02 LAB — SEDIMENTATION RATE: Sed Rate: 26 mm/h (ref 0–40)

## 2022-12-02 LAB — ALLERGEN PROFILE, SHELLFISH
Clam IgE: 0.22 kU/L — AB
F023-IgE Crab: 0.16 kU/L — AB
F080-IgE Lobster: 0.14 kU/L — AB
F290-IgE Oyster: 0.21 kU/L — AB
Scallop IgE: 0.21 kU/L — AB
Shrimp IgE: 0.13 kU/L — AB

## 2022-12-02 LAB — ANA W/REFLEX: Anti Nuclear Antibody (ANA): NEGATIVE

## 2022-12-02 LAB — THYROID ANTIBODIES: Thyroperoxidase Ab SerPl-aCnc: <9 [IU]/mL (ref 0–34)

## 2022-12-02 LAB — PANEL 604721
Jug R 1 IgE: <0.1 kU/L
Jug R 3 IgE: <0.1 kU/L

## 2022-12-02 LAB — PANEL 604239: ANA O 3 IgE: <0.1 kU/L

## 2022-12-02 LAB — O214-IGE CCD DETERMINANTS: O214-IgE CCD Determinants: 0.11 kU/L — AB

## 2022-12-02 LAB — TRYPTASE

## 2022-12-02 LAB — TSH: TSH: 1.64 u[IU]/mL (ref 0.450–4.500)

## 2022-12-02 LAB — CHRONIC URTICARIA: cu index: 2.6 (ref <10)

## 2023-02-10 ENCOUNTER — Ambulatory Visit
Admission: RE | Admit: 2023-02-10 | Discharge: 2023-02-10 | Disposition: A | Discharge status: Home / Self Care | Payer: BC Managed Care – PPO | Source: Ambulatory Visit | Attending: Family Medicine | Admitting: Family Medicine

## 2023-02-10 ENCOUNTER — Other Ambulatory Visit: Payer: Self-pay | Admitting: Family Medicine

## 2023-02-10 DIAGNOSIS — R109 Unspecified abdominal pain: Secondary | ICD-10-CM

## 2023-02-10 MED ORDER — IOPAMIDOL (ISOVUE-300) INJECTION 61%
500.0000 mL | Freq: Once | INTRAVENOUS | Status: AC | PRN
  Administered 2023-02-10: 100 mL via INTRAVENOUS

## 2023-02-14 ENCOUNTER — Other Ambulatory Visit (HOSPITAL_COMMUNITY): Payer: Self-pay | Admitting: Gastroenterology

## 2023-02-14 DIAGNOSIS — R1011 Right upper quadrant pain: Secondary | ICD-10-CM

## 2023-02-15 ENCOUNTER — Ambulatory Visit (HOSPITAL_COMMUNITY)
Admission: RE | Admit: 2023-02-15 | Discharge: 2023-02-15 | Disposition: A | Discharge status: Home / Self Care | Payer: BC Managed Care – PPO | Source: Ambulatory Visit | Attending: Gastroenterology | Admitting: Gastroenterology

## 2023-02-15 ENCOUNTER — Other Ambulatory Visit (HOSPITAL_COMMUNITY): Payer: Self-pay | Admitting: Gastroenterology

## 2023-02-15 DIAGNOSIS — R1011 Right upper quadrant pain: Secondary | ICD-10-CM | POA: Insufficient documentation

## 2023-02-21 ENCOUNTER — Encounter (HOSPITAL_COMMUNITY): Payer: BC Managed Care – PPO

## 2023-02-21 ENCOUNTER — Encounter (HOSPITAL_COMMUNITY): Payer: Self-pay

## 2023-03-09 ENCOUNTER — Ambulatory Visit: Payer: BC Managed Care – PPO | Admitting: Allergy

## 2023-06-14 ENCOUNTER — Ambulatory Visit: Admitting: Obstetrics

## 2023-06-14 ENCOUNTER — Encounter: Payer: Self-pay | Admitting: Obstetrics

## 2023-06-14 ENCOUNTER — Other Ambulatory Visit (HOSPITAL_COMMUNITY)
Admission: RE | Admit: 2023-06-14 | Discharge: 2023-06-14 | Disposition: A | Discharge status: Home / Self Care | Source: Ambulatory Visit | Attending: Obstetrics | Admitting: Obstetrics

## 2023-06-14 VITALS — Ht 70.0 in | Wt 213.1 lb

## 2023-06-14 DIAGNOSIS — Z01419 Encounter for gynecological examination (general) (routine) without abnormal findings: Secondary | ICD-10-CM | POA: Diagnosis present

## 2023-06-14 DIAGNOSIS — Z1239 Encounter for other screening for malignant neoplasm of breast: Secondary | ICD-10-CM

## 2023-06-14 DIAGNOSIS — N898 Other specified noninflammatory disorders of vagina: Secondary | ICD-10-CM

## 2023-06-14 DIAGNOSIS — Z113 Encounter for screening for infections with a predominantly sexual mode of transmission: Secondary | ICD-10-CM | POA: Diagnosis not present

## 2023-06-14 DIAGNOSIS — R21 Rash and other nonspecific skin eruption: Secondary | ICD-10-CM

## 2023-06-14 MED ORDER — CICLOPIROX OLAMINE 0.77 % EX CREA: TOPICAL_CREAM | Freq: Two times a day (BID) | CUTANEOUS | 2 refills | Status: AC

## 2023-06-14 NOTE — Progress Notes (Signed)
 Pt presents for annual. Pt has no questions or concerns at this time.

## 2023-06-14 NOTE — Progress Notes (Signed)
 Subjective:        Debra  Duncan is a 58 y.o. female here for a routine exam.  Current complaints: Vaginal discharge.    Personal health questionnaire:  Is patient Ashkenazi Jewish, have a family history of breast and/or ovarian cancer: yes Is there a family history of uterine cancer diagnosed at age < 42, gastrointestinal cancer, urinary tract cancer, family member who is a Personnel officer syndrome-associated carrier: no Is the patient overweight and hypertensive, family history of diabetes, personal history of gestational diabetes, preeclampsia or PCOS: no Is patient over 43, have PCOS,  family history of premature CHD under age 74, diabetes, smoke, have hypertension or peripheral artery disease:  no At any time, has a partner hit, kicked or otherwise hurt or frightened you?: no Over the past 2 weeks, have you felt down, depressed or hopeless?: no Over the past 2 weeks, have you felt little interest or pleasure in doing things?:no   Gynecologic History No LMP recorded. Patient has had an ablation. Contraception: post menopausal status Last Pap: 2023. Results were: LGSIL Last mammogram: 2025. Results were: normal  Obstetric History OB History  Gravida Para Term Preterm AB Living  3     3  SAB IAB Ectopic Multiple Live Births          # Outcome Date GA Lbr Len/2nd Weight Sex Type Anes PTL Lv  3 Gravida           2 Gravida           1 [place]             Past Medical History:  Diagnosis Date   Hypertension     Past Surgical History:  Procedure Laterality Date   btl       Current Outpatient Medications:    cetirizine  (ZYRTEC  ALLERGY) 10 MG tablet, Take 1 tablet (10 mg total) by mouth daily., Disp: 30 tablet, Rfl: 5   ciclopirox (LOPROX) 0.77 % cream, Apply topically 2 (two) times daily., Disp: 60 g, Rfl: 2   EPINEPHRINE  0.3 mg/0.3 mL IJ SOAJ injection, INJECT 0.3 MG INTO THE MUSCLE AS NEEDED FOR ANAPHYLAXIS., Disp: 2 each, Rfl: 1   rosuvastatin (CRESTOR) 10 MG tablet, Take  10 mg by mouth daily., Disp: , Rfl:    EPINEPHrine  (EPIPEN  2-PAK) 0.3 mg/0.3 mL IJ SOAJ injection, , Disp: , Rfl:    famotidine  (PEPCID ) 20 MG tablet, Take 1 tablet (20 mg total) by mouth daily. (Patient not taking: Reported on 06/14/2023), Disp: 30 tablet, Rfl: 5   metroNIDAZOLE  (FLAGYL ) 500 MG tablet, Take 1 tablet (500 mg total) by mouth 2 (two) times daily. (Patient not taking: Reported on 06/14/2023), Disp: 14 tablet, Rfl: 2   Multiple Vitamins-Minerals (HAIR/SKIN/NAILS/BIOTIN PO), Take 1 tablet by mouth daily. (Patient not taking: Reported on 06/14/2023), Disp: , Rfl:    triamcinolone ointment (KENALOG) 0.1 %, Apply 1 Application topically 2 (two) times daily. (Patient not taking: Reported on 06/14/2023), Disp: , Rfl:    valsartan-hydrochlorothiazide (DIOVAN-HCT) 160-25 MG tablet, Take 1 tablet by mouth daily. (Patient not taking: Reported on 06/14/2023), Disp: , Rfl:  Allergies  Allergen Reactions   Codeine Hives and Swelling   Doxycycline Hives, Itching and Swelling    Social History   Tobacco Use   Smoking status: Never   Smokeless tobacco: Never  Substance Use Topics   Alcohol use: No    History reviewed. No pertinent family history.    Review of Systems  Constitutional: negative for fatigue and weight  loss Respiratory: negative for cough and wheezing Cardiovascular: negative for chest pain, fatigue and palpitations Gastrointestinal: negative for abdominal pain and change in bowel habits Musculoskeletal:negative for myalgias Neurological: negative for gait problems and tremors Behavioral/Psych: negative for abusive relationship, depression Endocrine: negative for temperature intolerance    Genitourinary: positive for vaginal discharge.  negative for abnormal menstrual periods, genital lesions, hot flashes, sexual problems  Integument/breast: negative for breast lump, breast tenderness, nipple discharge and skin lesion(s)    Objective:       Ht 5\' 10"  (1.778 m)   Wt 213  lb 1.6 oz (96.7 kg)   BMI 30.58 kg/m  General:   Alert and no distress  Skin:   no rash or abnormalities  Lungs:   clear to auscultation bilaterally  Heart:   regular rate and rhythm, S1, S2 normal, no murmur, click, rub or gallop  Breasts:   normal without suspicious masses, skin or nipple changes or axillary nodes  Abdomen:  normal findings: no organomegaly, soft, non-tender and no hernia  Pelvis:  External genitalia: normal general appearance Urinary system: urethral meatus normal and bladder without fullness, nontender Vaginal: normal without tenderness, induration or masses Cervix: normal appearance Adnexa: normal bimanual exam Uterus: anteverted and non-tender, normal size   Lab Review Urine pregnancy test Labs reviewed yes Radiologic studies reviewed yes  I have spent a total of 20 minutes of face-to-face time, excluding clinical staff time, reviewing notes and preparing to see patient, ordering tests and/or medications, and counseling the patient.   Assessment:    1. Encounter for gynecological examination with Papanicolaou smear of cervix (Primary) Rx: - Cytology - PAP( [place])  2. Vaginal discharge Rx: - Cervicovaginal ancillary only( [place])  3. Screen for STD (sexually transmitted disease) Rx: - RPR - HIV antibody (with reflex) - Hepatitis C Antibody - Hepatitis B Surface AntiGEN  4. Screening breast examination Rx: - MM Digital Screening; Future  5. Rash Rx: - ciclopirox (LOPROX) 0.77 % cream; Apply topically 2 (two) times daily.  Dispense: 60 g; Refill: 2     Plan:    Education reviewed: calcium supplements, depression evaluation, low fat, low cholesterol diet, safe sex/STD prevention, self breast exams, and weight bearing exercise. Follow up in: 1 year.   Meds ordered this encounter  Medications   ciclopirox (LOPROX) 0.77 % cream    Sig: Apply topically 2 (two) times daily.    Dispense:  60 g    Refill:  2   Orders Placed This  Encounter  Procedures   MM Digital Screening    Standing Status:   Future    Expiration Date:   06/13/2024    Reason for Exam (SYMPTOM  OR DIAGNOSIS REQUIRED):   Screening    Is the patient pregnant?:   No    Preferred imaging location?:   [facility]   RPR   HIV antibody (with reflex)   Hepatitis C Antibody   Hepatitis B Surface AntiGEN    Gabrielle Joiner, MD, FACOG Attending Obstetrician & Gynecologist, [facility] for [facility], [facility], [place] 06/14/2023

## 2023-06-15 LAB — CERVICOVAGINAL ANCILLARY ONLY
Bacterial Vaginitis (gardnerella): NEGATIVE
Candida Glabrata: NEGATIVE
Candida Vaginitis: NEGATIVE
Chlamydia: NEGATIVE
Comment: NEGATIVE
Comment: NEGATIVE
Comment: NEGATIVE
Comment: NEGATIVE
Comment: NEGATIVE
Comment: NORMAL
Neisseria Gonorrhea: NEGATIVE
Trichomonas: NEGATIVE

## 2023-06-15 LAB — HEPATITIS C ANTIBODY: Hep C Virus Ab: NONREACTIVE

## 2023-06-15 LAB — HEPATITIS B SURFACE ANTIGEN: Hepatitis B Surface Ag: NEGATIVE

## 2023-06-15 LAB — HIV ANTIBODY (ROUTINE TESTING W REFLEX): HIV Screen 4th Generation wRfx: NONREACTIVE

## 2023-06-15 LAB — RPR: RPR Ser Ql: NONREACTIVE

## 2023-06-21 LAB — CYTOLOGY - PAP
Comment: NEGATIVE
Diagnosis: UNDETERMINED — AB
High risk HPV: NEGATIVE

## 2023-06-22 ENCOUNTER — Ambulatory Visit: Payer: Self-pay | Admitting: Family Medicine

## 2023-06-29 ENCOUNTER — Ambulatory Visit
